# Patient Record
Sex: Female | Born: 1980 | Race: Asian | Hispanic: No | Smoking: Former smoker
Health system: Southern US, Community
[De-identification: ages and names within clinical notes are randomized; demographics above are authoritative.]

## PROBLEM LIST (undated history)

## (undated) DIAGNOSIS — Z789 Other specified health status: Secondary | ICD-10-CM

## (undated) DIAGNOSIS — G43909 Migraine, unspecified, not intractable, without status migrainosus: Secondary | ICD-10-CM

## (undated) HISTORY — DX: Migraine, unspecified, not intractable, without status migrainosus: G43.909

## (undated) HISTORY — PX: NO PAST SURGERIES: SHX2092

---

## 2006-02-28 ENCOUNTER — Inpatient Hospital Stay (HOSPITAL_COMMUNITY): Admission: AD | Admit: 2006-02-28 | Discharge: 2006-02-28 | Payer: Self-pay | Admitting: Obstetrics & Gynecology

## 2006-03-05 ENCOUNTER — Inpatient Hospital Stay (HOSPITAL_COMMUNITY): Admission: AD | Admit: 2006-03-05 | Discharge: 2006-03-06 | Payer: Self-pay | Admitting: Obstetrics & Gynecology

## 2006-03-09 ENCOUNTER — Ambulatory Visit: Payer: Self-pay | Admitting: Hematology and Oncology

## 2006-03-14 ENCOUNTER — Inpatient Hospital Stay (HOSPITAL_COMMUNITY): Admission: AD | Admit: 2006-03-14 | Discharge: 2006-03-18 | Payer: Self-pay | Admitting: Obstetrics & Gynecology

## 2006-03-23 LAB — CBC & DIFF AND RETIC
IRF: 0.54 — ABNORMAL HIGH (ref 0.130–0.330)
MCH: 20.4 pg — ABNORMAL LOW (ref 26.0–34.0)
MONO#: 2.4 10*3/uL — ABNORMAL HIGH (ref 0.1–0.9)
NEUT%: 84.3 % — ABNORMAL HIGH (ref 39.6–76.8)
RDW: 12.3 % (ref 11.3–14.5)
RETIC #: 138.9 10*3/uL — ABNORMAL HIGH (ref 19.7–115.1)
Retic %: 2.8 % — ABNORMAL HIGH (ref 0.4–2.3)
lymph#: 3.1 10*3/uL (ref 0.9–3.3)

## 2006-03-23 LAB — CHCC SMEAR

## 2006-03-26 LAB — IRON AND TIBC
%SAT: 60 % — ABNORMAL HIGH (ref 20–55)
Iron: 181 ug/dL — ABNORMAL HIGH (ref 42–145)

## 2006-03-26 LAB — HEMOGLOBINOPATHY EVALUATION
Hgb A2 Quant: 6.6 % — ABNORMAL HIGH (ref 2.2–3.2)
Hgb F Quant: 0.6 % — ABNORMAL HIGH (ref 0.0–0.5)
Hgb S Quant: 0 % (ref 0.0–0.0)

## 2006-03-26 LAB — COMPREHENSIVE METABOLIC PANEL
Albumin: 3.7 g/dL (ref 3.5–5.2)
Alkaline Phosphatase: 34 U/L — ABNORMAL LOW (ref 39–117)
Calcium: 9.2 mg/dL (ref 8.4–10.5)
Chloride: 97 mEq/L (ref 96–112)
Total Bilirubin: 0.4 mg/dL (ref 0.3–1.2)

## 2006-04-19 LAB — FERRITIN: Ferritin: 126 ng/mL (ref 10–291)

## 2006-04-19 LAB — CBC WITH DIFFERENTIAL/PLATELET
EOS%: 1.5 % (ref 0.0–7.0)
HCT: 27.3 % — ABNORMAL LOW (ref 34.8–46.6)
HGB: 8.8 g/dL — ABNORMAL LOW (ref 11.6–15.9)
MCH: 22.2 pg — ABNORMAL LOW (ref 26.0–34.0)
MCHC: 32.2 g/dL (ref 32.0–36.0)
MONO%: 6.7 % (ref 0.0–13.0)
Platelets: 271 10*3/uL (ref 145–400)
WBC: 8.8 10*3/uL (ref 3.9–10.0)

## 2006-04-19 LAB — IRON AND TIBC
%SAT: 26 % (ref 20–55)
Iron: 71 ug/dL (ref 42–145)
UIBC: 204 ug/dL

## 2006-05-17 ENCOUNTER — Ambulatory Visit (HOSPITAL_COMMUNITY): Admission: RE | Admit: 2006-05-17 | Discharge: 2006-05-17 | Payer: Self-pay | Admitting: Obstetrics & Gynecology

## 2006-06-11 ENCOUNTER — Ambulatory Visit: Payer: Self-pay | Admitting: Hematology and Oncology

## 2006-10-01 ENCOUNTER — Inpatient Hospital Stay (HOSPITAL_COMMUNITY): Admission: AD | Admit: 2006-10-01 | Discharge: 2006-10-01 | Payer: Self-pay | Admitting: Obstetrics & Gynecology

## 2006-10-04 ENCOUNTER — Inpatient Hospital Stay (HOSPITAL_COMMUNITY): Admission: AD | Admit: 2006-10-04 | Discharge: 2006-10-06 | Payer: Self-pay | Admitting: Obstetrics

## 2007-06-22 IMAGING — US US OB COMP LESS 14 WK
1 series · 13 of 28 positions shown · non-contrast
Comparison: none

CLINICAL DATA: Nausea and vomiting. Assess dates.  
OBSTETRICAL ULTRASOUND <14 WKS AND TRANSVAGINAL OB US:
TECHNIQUE: Both transabdominal and transvaginal ultrasound examinations were performed for complete evaluation of the gestation as well as the maternal uterus, adnexal regions, and pelvic cul-de-sac.

[Series 1: us ob comp less 14 wk · 0.22mm/px · 13 of 61 slices shown]
[im 3/61]
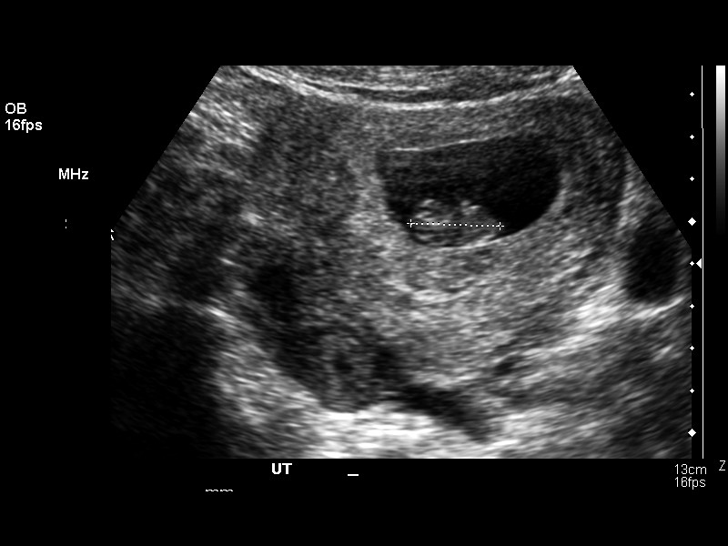
[im 7/61]
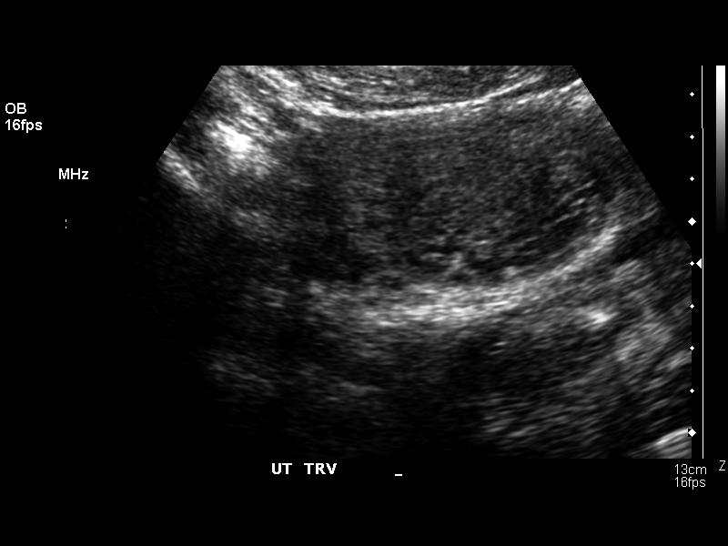
[im 12/61]
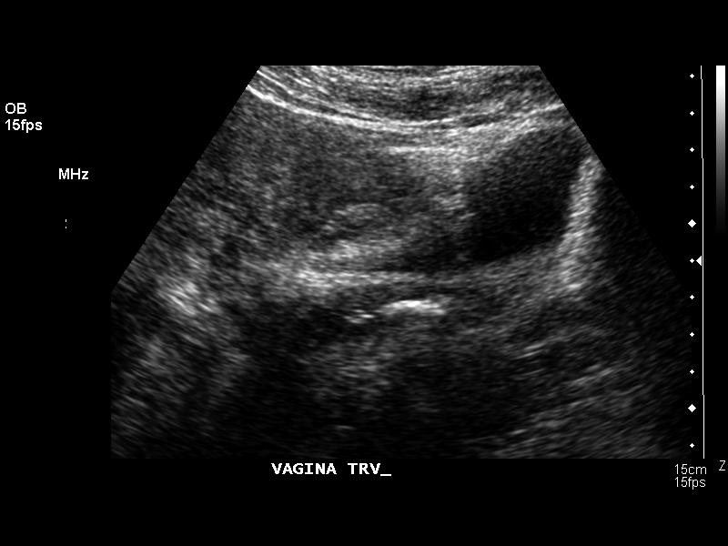
[im 16/61]
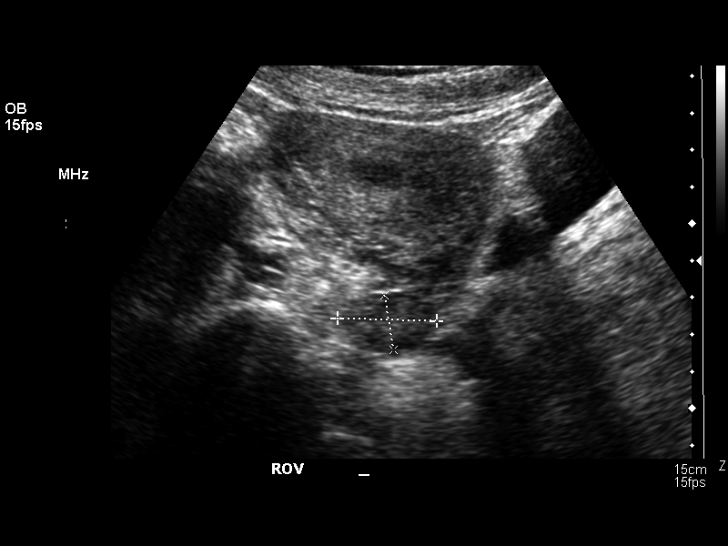
[im 21/61]
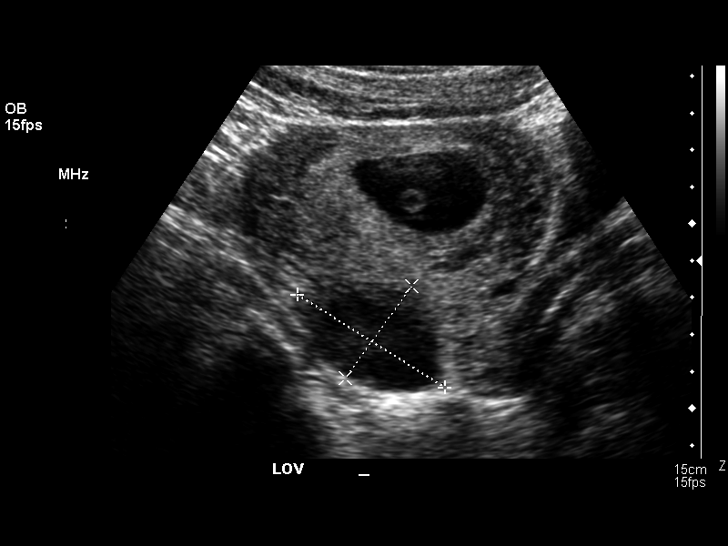
[im 25/61]
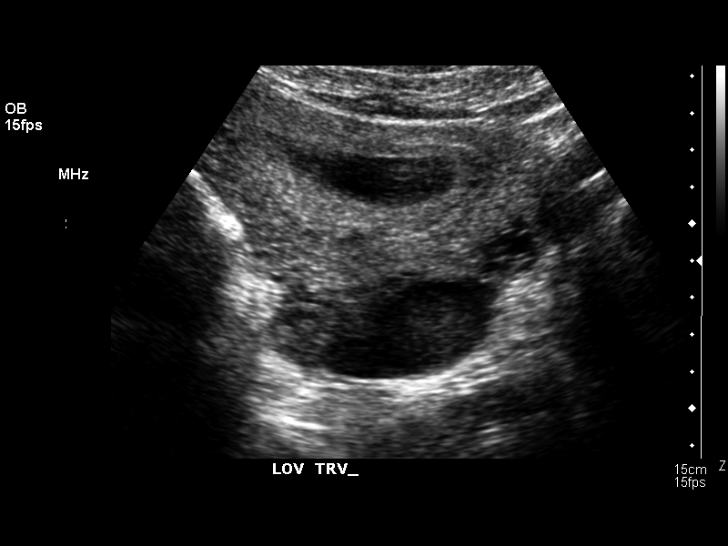
[im 32/61]
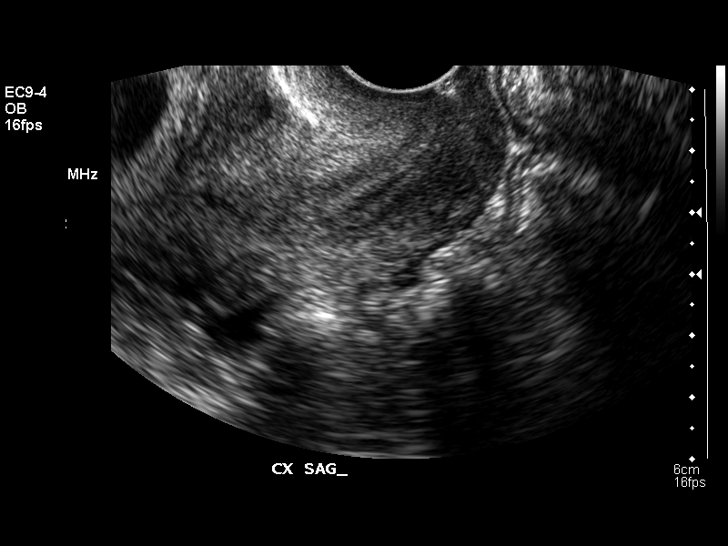
[im 36/61]
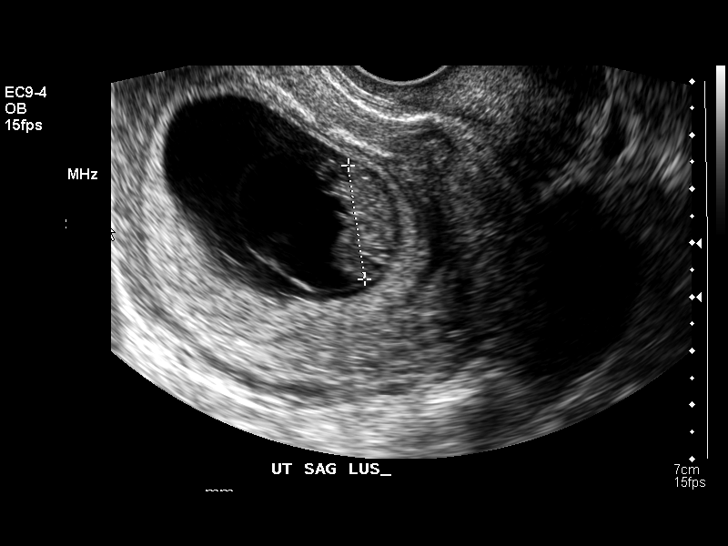
[im 41/61]
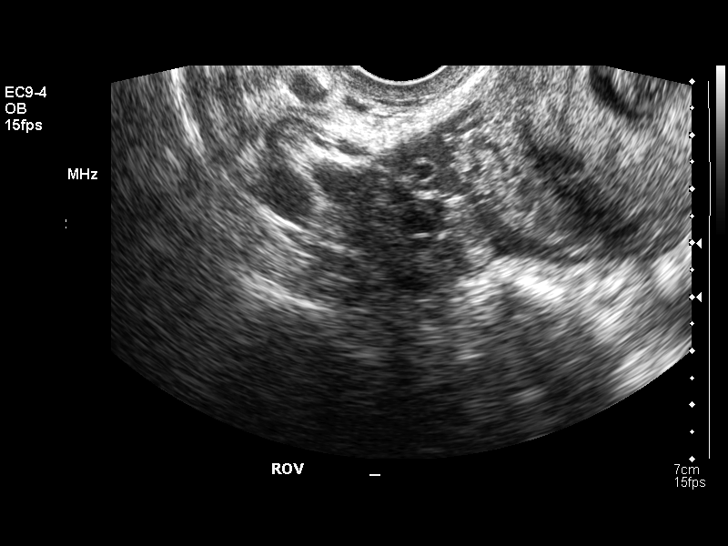
[im 45/61]
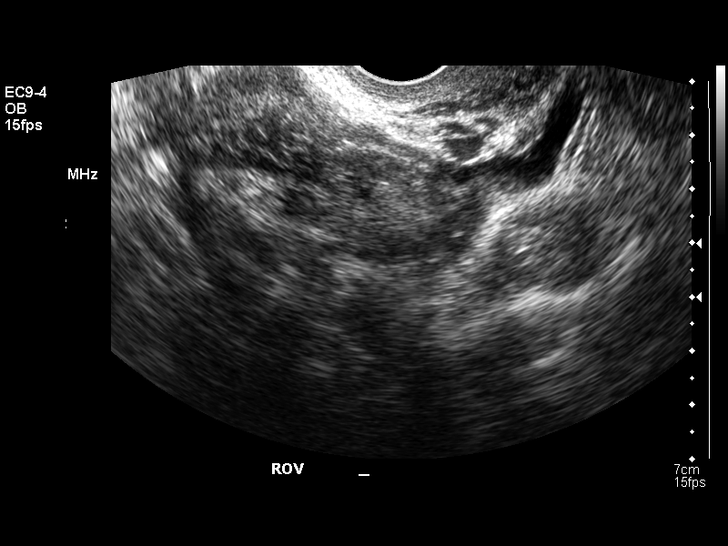
[im 49/61]
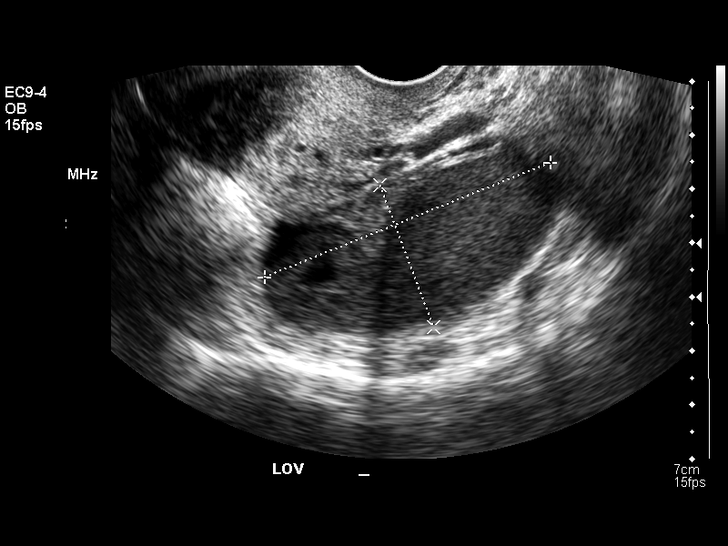
[im 54/61]
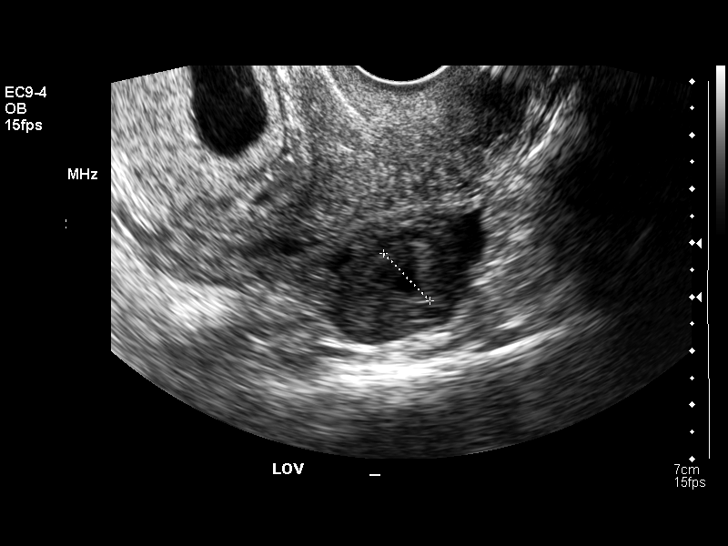
[im 58/61]
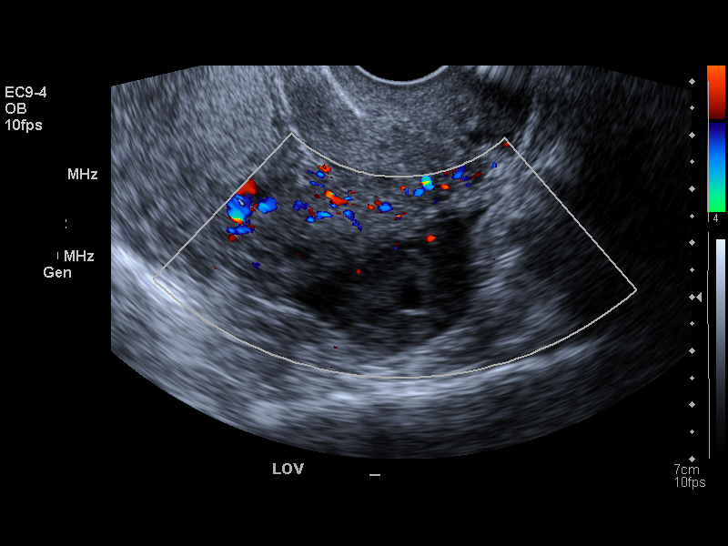

[13 of 28 positions shown; findings below may reference images not displayed]

FINDINGS: Multiple images of the uterus and adnexa were obtained using a transabdominal and endovaginal approaches.
There is a single intrauterine pregnancy identified that demonstrates an estimated gestational age by crown rump length of 8 weeks and 5 days.  Positive regular fetal cardiac activity with a rate of 171 bpm was noted.  A normal appearing yolk sac and amnion are seen.  No signs of subchorionic hemorrhage are evident.
The right ovary has a normal appearance measuring 2.5 x 1.6 x 2.5 cm.  The left ovary measures 5.7 x 2.8 x 4.4 cm and contains a corpus luteum cyst, as well as a unilocular complex cyst which measures 3.8 x 3.8 x 2.3 cm and contains diffuse low level echoes.  The overall appearance is most suggestive of either a hemorrhagic cyst or endometrioma and follow-up evaluation is recommended at anatomic reassessment.  If this finding persists it most likely represents an endometrioma.  If interval resolution takes place the finding would be compatible with a hemorrhagic cyst.
IMPRESSION: 8 week 5 day living intrauterine pregnancy.  Hemorrhagic cyst versus endometrioma of the left ovary.  Recommend reevaluation at the time of anatomic assessment for help in differentiating between these two possibilities.

## 2007-09-03 IMAGING — US US OB COMP +14 WK
1 series · 13 of 28 positions shown · non-contrast
Comparison: none

CLINICAL DATA: Anatomic exam.  No current problems.

[Series 1: us ob comp +14 wk · 0.29mm/px · 13 of 124 slices shown]
[im 5/124]
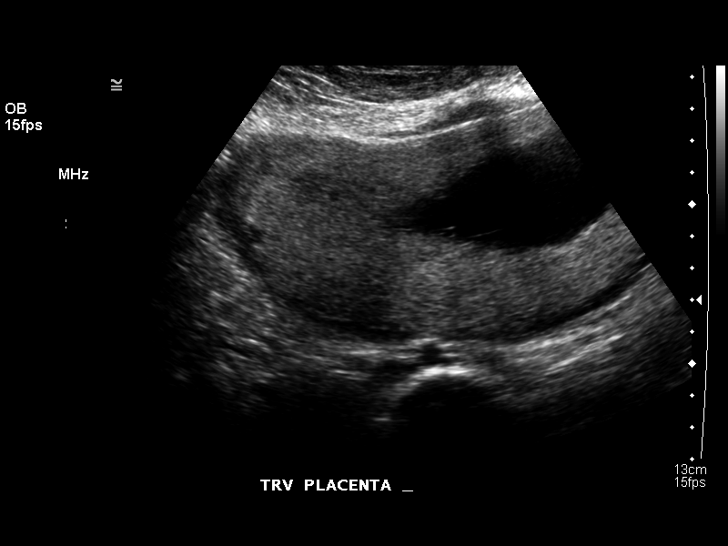
[im 14/124]
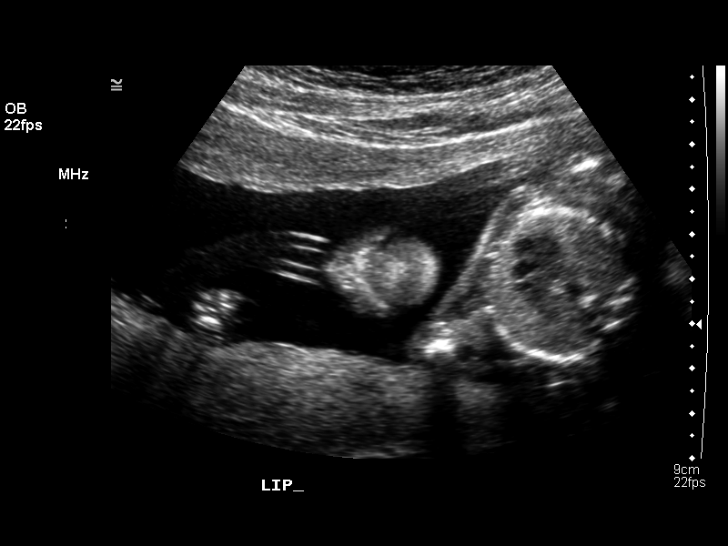
[im 23/124]
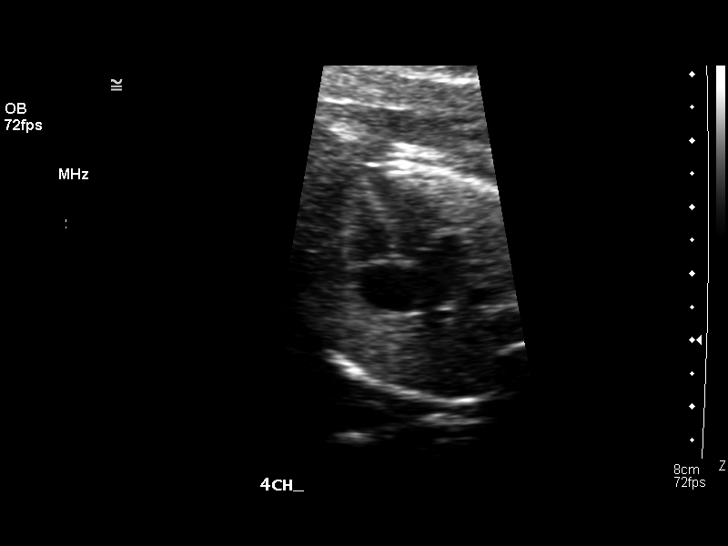
[im 32/124]
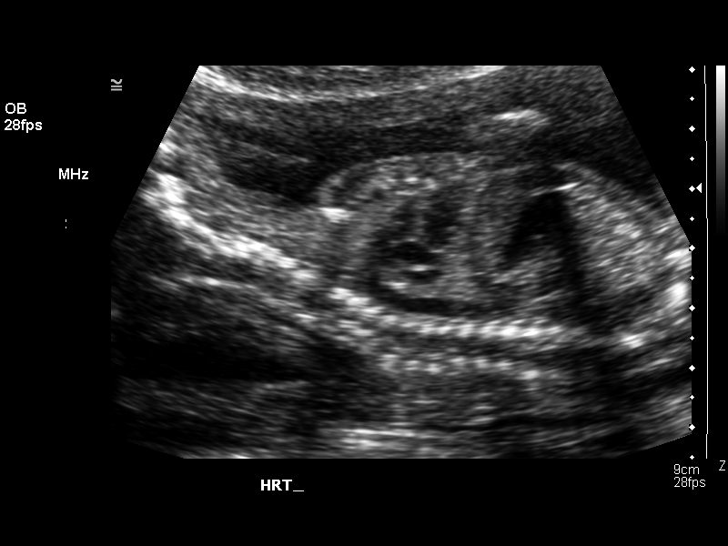
[im 42/124]
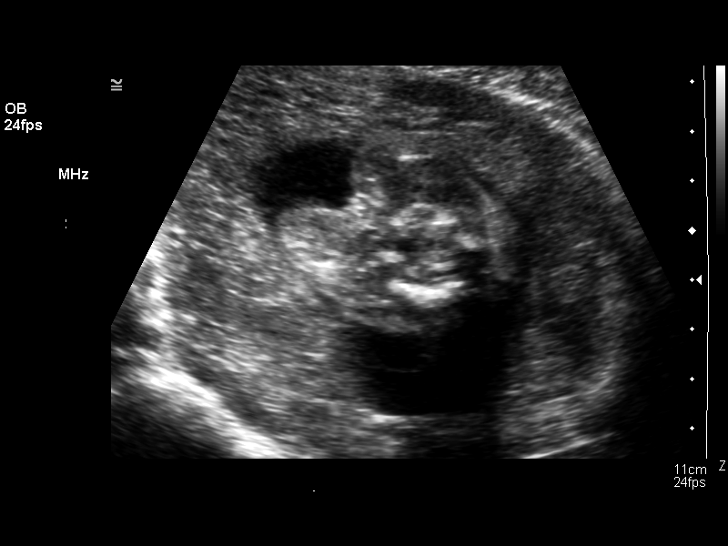
[im 51/124]
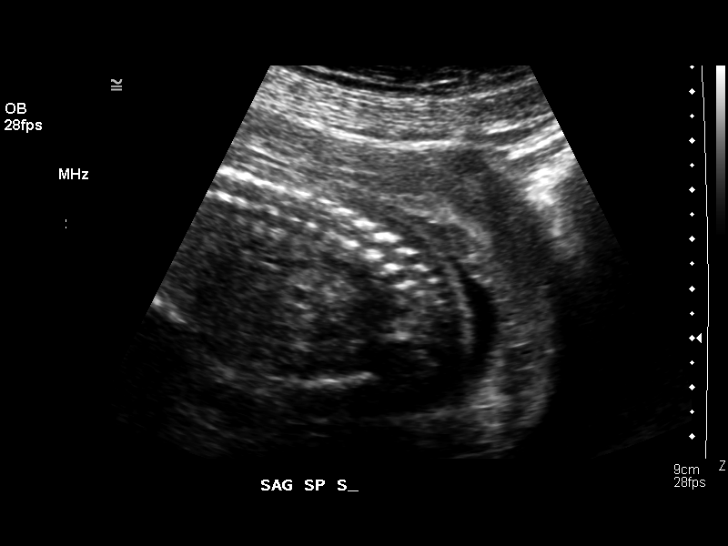
[im 64/124]
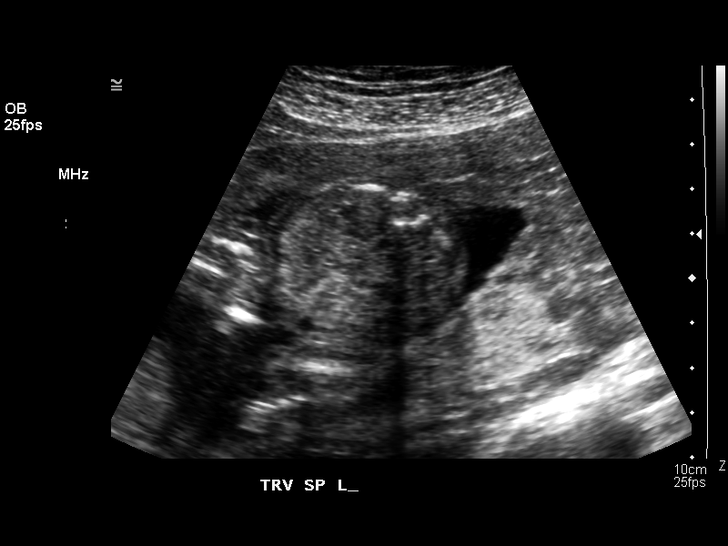
[im 73/124]
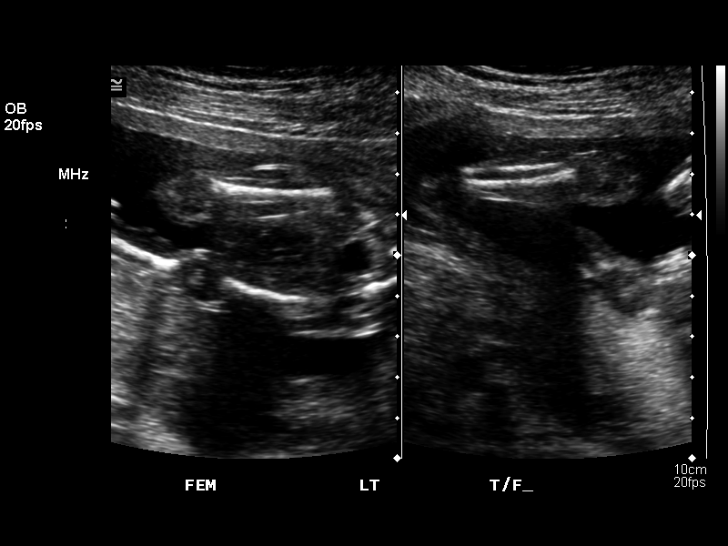
[im 83/124]
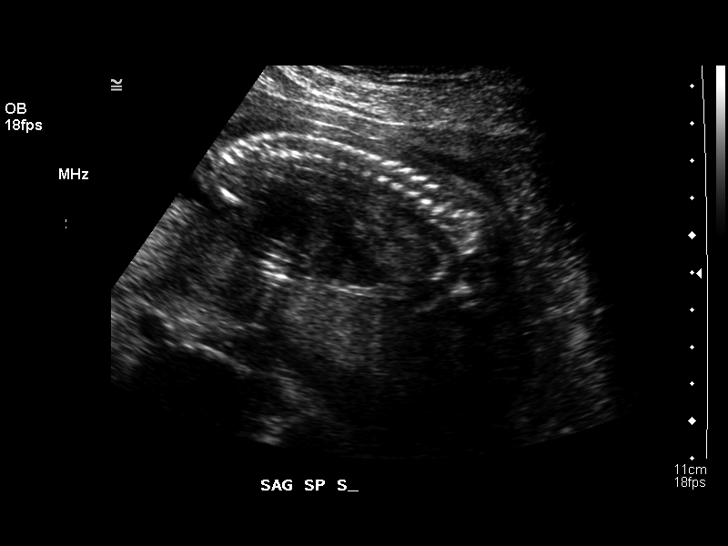
[im 92/124]
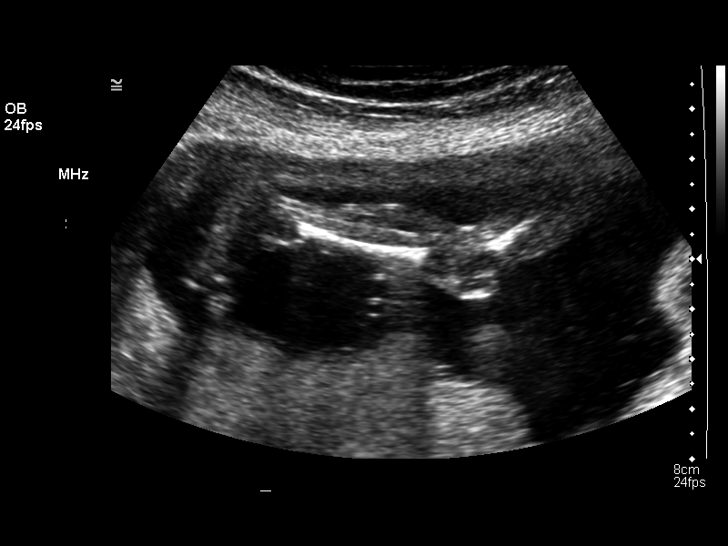
[im 101/124]
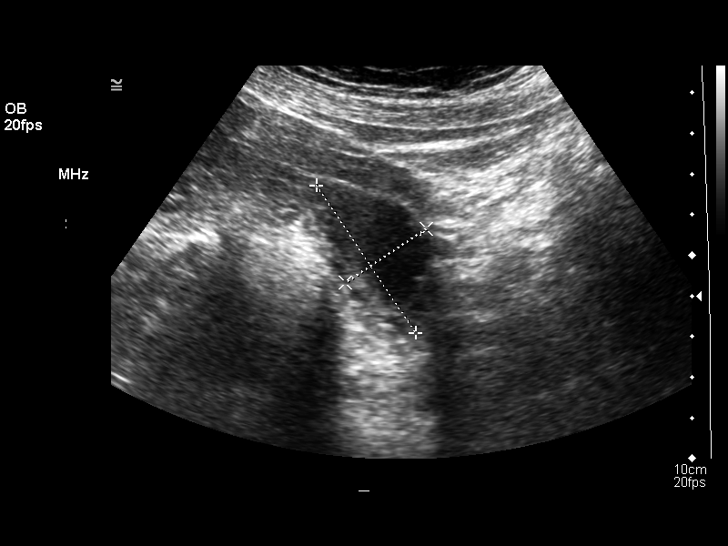
[im 110/124]
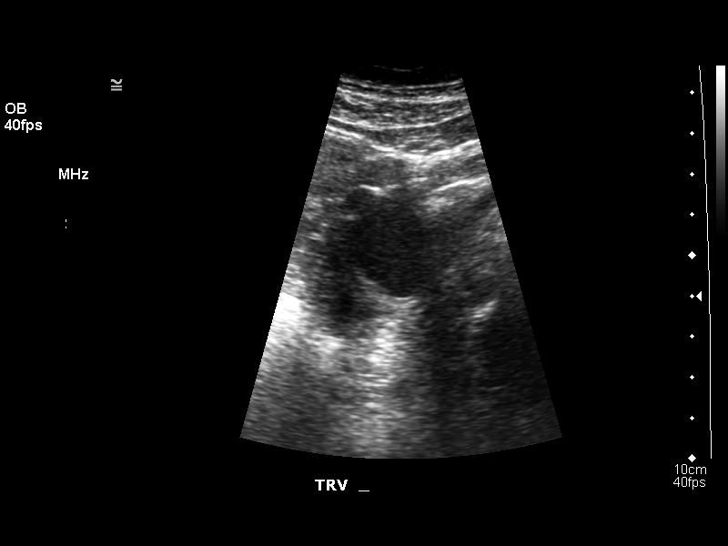
[im 119/124]
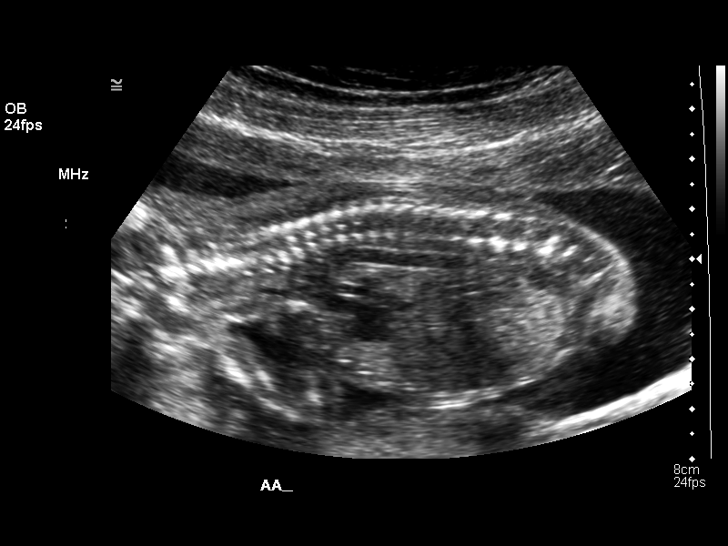

[13 of 28 positions shown; findings below may reference images not displayed]

OBSTETRICAL ULTRASOUND:
 Number of Fetuses:  1
 Heart Rate:  157 bpm
 Movement:  Yes
 Breathing:  No  
 Presentation:  Variable
 Placental Location:  Posterior
 Grade:  I
 Previa:  No
 Amniotic Fluid (Subjective):  Normal
 Amniotic Fluid (Objective):   5.2 cm vertical pocket 

 FETAL BIOMETRY
 BPD:   4.5 cm  19 w 4 d
 HC:   16.9 cm  19 w 4 d
 AC:   14.9 cm  20 w 1 d
 FL:  3.0 cm  19 w 2 d

 MEAN GA:    19 w 5 d    US EDC:  10/06/06
 Assigned GA: 19 w 6 d  Assigned EDC: 10/05/06

 FETAL ANATOMY
 Lateral Ventricles:  Visualized  
 Thalami/CSP:  Visualized  
 Posterior Fossa:  Visualized   
 Nuchal Region:  Visualized 
 Spine:  Visualized  
 4 Chamber Heart on Left:  Visualized  
 Stomach on Left:  Visualized  
 3 Vessel Cord:  Visualized 
 Cord Insertion site:  Visualized 
 Kidneys:  Visualized   
 Bladder:  Visualized   
 Extremities:  Visualized  

 ADDITIONAL ANATOMY VISUALIZED:  LVOT, RVOT, upper lip, orbits, profile, diaphragm, heel, 5th digit, ductal arch, and aortic arch.

 MATERNAL UTERINE AND ADNEXAL FINDINGS
 Cervix:   3.3 cm transabdominally.

 Noted is the presence of a simple left ovarian cyst measuring 2.1 x 3.0 x 1.6 cm.
IMPRESSION: 1.  Single intrauterine pregnancy demonstrating an estimated gestational age by ultrasound of 19 weeks and 5 days.  Correlation with assigned gestational age by LMP of 19 weeks and 6 days correlates with appropriate growth.
 2.  No focal fetal or placental abnormalities are noted with a good anatomic exam possible.
 3.  A simple left ovarian cyst is identified with measurements as noted above.  As this has persisted past 16 weeks of gestation, this is unlikely to represent corpus luteum cyst.  This may represent a persistent functional cyst of other etiology or a benign neoplastic process.  This could be evaluated later in gestation if additional obstetrical scans are performed or postpartum.  The simple nature suggests that this is benign in etiology.

## 2014-06-24 ENCOUNTER — Ambulatory Visit (HOSPITAL_COMMUNITY): Payer: BC Managed Care – PPO

## 2014-06-30 ENCOUNTER — Other Ambulatory Visit (HOSPITAL_COMMUNITY): Payer: Self-pay | Admitting: Obstetrics and Gynecology

## 2014-06-30 DIAGNOSIS — Z3491 Encounter for supervision of normal pregnancy, unspecified, first trimester: Secondary | ICD-10-CM

## 2014-06-30 DIAGNOSIS — Z36 Encounter for antenatal screening for chromosomal anomalies: Secondary | ICD-10-CM

## 2014-07-01 ENCOUNTER — Ambulatory Visit (HOSPITAL_COMMUNITY): Payer: BC Managed Care – PPO

## 2014-07-14 ENCOUNTER — Encounter (HOSPITAL_COMMUNITY): Payer: Self-pay

## 2014-07-14 ENCOUNTER — Ambulatory Visit (HOSPITAL_COMMUNITY)
Admission: RE | Admit: 2014-07-14 | Discharge: 2014-07-14 | Disposition: A | Discharge status: Home / Self Care | Payer: BC Managed Care – PPO | Source: Ambulatory Visit | Attending: Obstetrics and Gynecology | Admitting: Obstetrics and Gynecology

## 2014-07-14 DIAGNOSIS — Z36 Encounter for antenatal screening for chromosomal anomalies: Secondary | ICD-10-CM | POA: Insufficient documentation

## 2014-07-14 DIAGNOSIS — O09529 Supervision of elderly multigravida, unspecified trimester: Secondary | ICD-10-CM | POA: Insufficient documentation

## 2014-07-14 DIAGNOSIS — Z3A11 11 weeks gestation of pregnancy: Secondary | ICD-10-CM | POA: Insufficient documentation

## 2014-07-14 DIAGNOSIS — Z315 Encounter for genetic counseling: Secondary | ICD-10-CM | POA: Diagnosis not present

## 2014-07-14 DIAGNOSIS — O09521 Supervision of elderly multigravida, first trimester: Secondary | ICD-10-CM | POA: Insufficient documentation

## 2014-07-14 DIAGNOSIS — Z3491 Encounter for supervision of normal pregnancy, unspecified, first trimester: Secondary | ICD-10-CM

## 2014-07-14 HISTORY — DX: Other specified health status: Z78.9

## 2014-07-14 LAB — ROUTINE CHROMOSOME - KARYOTYPE + FISH

## 2014-07-15 ENCOUNTER — Telehealth (HOSPITAL_COMMUNITY): Payer: Self-pay

## 2014-07-15 NOTE — Telephone Encounter (Signed)
Glenda BouillonVictoria Weiss, [facility] Genetic counseling intern, called [name] to discuss the preliminary FISH results from her CVS.  We reviewed that the results revealed an extra signal for chromosome 21 in all cells analyzed.  We discussed that this is consistent with a diagnosis of Down syndrome.  We again discussed the limitations of FISH and that final results are still pending and will be available in 1-2 weeks.  Patient was offered f/u genetic counseling to discuss the preliminary results.  She would like to discuss this with her husband and will call back to let us know if she is interested in scheduling an appointment.  She is undecided re: TOP at this time.  All questions were answered to her satisfaction, she was encouraged to call with additional questions or concerns.  Marjie SkiffVictoria Weiss [employer] Genetic Counseling Intern  Donald Prosehristy S. Sheffield Hawker, MS Certified Genetic Counselor

## 2014-07-16 ENCOUNTER — Other Ambulatory Visit (HOSPITAL_COMMUNITY): Payer: Self-pay

## 2014-07-16 NOTE — Progress Notes (Signed)
Genetic Counseling  High-Risk Gestation Note  Appointment Date:  07/14/2014 Referred By: Glenda LaurenceHorvath, Michelle A, MD Date of Birth:  1981/01/22 Partner:  Glenda Weiss   Pregnancy History: [id number] Estimated Date of Delivery: 01/28/15 Estimated Gestational Age: 5471w5d Attending: Rema FendtJoshua Nitsche, MD   Glenda Weiss and her partner, Glenda Weiss, were seen for genetic counseling because of Weiss maternal age of 33 y.o.. She will be 33 years old at delivery. [employer] Genetic Counseling Intern, Glenda Weiss, assisted with genetic counseling, under my direct supervision.     They were counseled regarding maternal age and the association with risk for chromosome conditions due to nondisjunction with aging of the ova.   We reviewed chromosomes, nondisjunction, and the associated 1 in 114 risk for fetal aneuploidy at 5371w5d gestation related to Weiss maternal age of 33 years old at delivery.  They were counseled that the risk for aneuploidy decreases as gestational age increases, accounting for those pregnancies which spontaneously abort.  We specifically discussed Down syndrome (trisomy 8821), trisomies 4513 and 318, and sex chromosome aneuploidies (47,XXX and 47,XXY) including the common features and prognoses of each.   We reviewed available screening options including First Screen, Quad screen, noninvasive prenatal screening (NIPS)/cell free DNA (cfDNA) testing, and detailed ultrasound.  They were counseled that screening tests are used to modify Weiss patient's Weiss priori risk for aneuploidy, typically based on age. This estimate provides Weiss pregnancy specific risk assessment. We reviewed the benefits and limitations of each option. Specifically, we discussed the conditions for which each test screens, the detection rates, and false positive rates of each. They were also counseled regarding diagnostic testing via CVS and amniocentesis. We discussed the risks, benefits, and limitations of each. Regarding CVS, we  discussed the associated risks for maternal cell contamination and confined placental mosaicism. We reviewed the approximate 1 in 100 risk for complications for CVS and the approximate 1 in 300-500 risk for complications for amniocentesis, including spontaneous pregnancy loss. They understand that CVS and amniocentesis do not typically assess for single gene conditions and would not diagnose or rule out all genetic conditions. After consideration of all the options, they elected to proceed with CVS, which was performed today under ultrasound guidance and sent for aneuploidy FISH and karyotype analyses. Complete ultrasound report sent under separate cover.   Both family histories were reviewed and found to be contributory congenital hearing loss/deafness for the patient's female maternal first cousin. He reportedly communicates through sign language. He is reportedly healthy and does not have intellectual disability or other medical conditions. The underlying etiology for his deafness was not known by the patient. Hearing loss can have many causes including genetic factors, environmental factors or Weiss combination of both.  Sometimes hearing loss can occur as one feature of an underlying genetic condition or may be caused by Weiss single nonworking gene.  Given the degree of relation, recurrence risk for the current pregnancy would likely be low. However, additional information regarding Weiss cause for their hearing loss is needed in order to most accurately assess the chance for their children. The family histories were otherwise unremarkable for birth defects, intellectual disability, and known genetic conditions. Without further information regarding the provided family history, an accurate genetic risk cannot be calculated. Further genetic counseling is warranted if more information is obtained.  Glenda Weiss denied exposure to environmental toxins or chemical agents. She denied the use of alcohol, tobacco or  street drugs. She denied significant viral illnesses during the course of her  pregnancy. Her medical and surgical histories were noncontributory.   I counseled this couple regarding the above risks and available options.  The approximate face-to-face time with the genetic counselor was 40 minutes.  Glenda PlowmanKaren Kamren Heintzelman, MS,  Certified Genetic Counselor 07/16/2014

## 2014-07-23 ENCOUNTER — Telehealth (HOSPITAL_COMMUNITY): Payer: Self-pay | Admitting: MS"

## 2014-07-23 ENCOUNTER — Other Ambulatory Visit (HOSPITAL_COMMUNITY): Payer: Self-pay

## 2014-07-23 NOTE — Telephone Encounter (Signed)
Left message for patient to return call.   Glenda Weiss 07/23/2014 12:48 PM

## 2014-07-23 NOTE — Telephone Encounter (Signed)
Called [name] regarding final karyotype result from [facility]. Patient identified by name and DOB. Discussed that these are consistent with [facility] results and that an extra 21 chromosome was detected, consistent with Down syndrome. 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 [name] stated that she does not want to know the sex from the chromosome results. This was not disclosed to her. Asked [name] if she has given much thought about what this result means for her or the father of the pregnancy as far as continuing the pregnancy or not. She stated that she has not thought about it much and that she needs to talk with the father of the pregnancy. Offered follow-up genetic counseling to discuss this more in person and to discuss resources and information regarding Down syndrome. She declined follow-up genetic counseling. Directed [name] to the website for the National Down syndrome Congress to obtain additional information regarding Down syndrome. She stated that she has a follow-up appointment with Dr. Henderson CloudHorvath in early December. Encouraged her to contact me with additional questions or concerns.   Clydie BraunKaren Medford Staheli 07/23/2014 3:46 PM

## 2014-07-28 ENCOUNTER — Other Ambulatory Visit (HOSPITAL_COMMUNITY): Payer: Self-pay

## 2014-08-05 ENCOUNTER — Ambulatory Visit (HOSPITAL_COMMUNITY)
Admission: RE | Admit: 2014-08-05 | Discharge: 2014-08-05 | Disposition: A | Discharge status: Home / Self Care | Payer: BC Managed Care – PPO | Source: Ambulatory Visit | Attending: Obstetrics and Gynecology | Admitting: Obstetrics and Gynecology

## 2014-08-05 ENCOUNTER — Ambulatory Visit (HOSPITAL_COMMUNITY): Payer: BC Managed Care – PPO | Admitting: Anesthesiology

## 2014-08-05 ENCOUNTER — Encounter (HOSPITAL_COMMUNITY): Payer: Self-pay | Admitting: *Deleted

## 2014-08-05 ENCOUNTER — Other Ambulatory Visit: Payer: Self-pay | Admitting: Obstetrics and Gynecology

## 2014-08-05 ENCOUNTER — Encounter (HOSPITAL_COMMUNITY)
Admission: RE | Disposition: A | Discharge status: Home / Self Care | Payer: Self-pay | Source: Ambulatory Visit | Attending: Obstetrics and Gynecology

## 2014-08-05 DIAGNOSIS — O99012 Anemia complicating pregnancy, second trimester: Secondary | ICD-10-CM | POA: Insufficient documentation

## 2014-08-05 DIAGNOSIS — Z87891 Personal history of nicotine dependence: Secondary | ICD-10-CM | POA: Insufficient documentation

## 2014-08-05 DIAGNOSIS — O351XX0 Maternal care for (suspected) chromosomal abnormality in fetus, not applicable or unspecified: Secondary | ICD-10-CM | POA: Insufficient documentation

## 2014-08-05 DIAGNOSIS — D649 Anemia, unspecified: Secondary | ICD-10-CM | POA: Diagnosis not present

## 2014-08-05 DIAGNOSIS — Z3A15 15 weeks gestation of pregnancy: Secondary | ICD-10-CM | POA: Insufficient documentation

## 2014-08-05 DIAGNOSIS — Q909 Down syndrome, unspecified: Secondary | ICD-10-CM

## 2014-08-05 DIAGNOSIS — Z332 Encounter for elective termination of pregnancy: Secondary | ICD-10-CM | POA: Diagnosis not present

## 2014-08-05 HISTORY — PX: DILATION AND EVACUATION: SHX1459

## 2014-08-05 LAB — ABO/RH: ABO/RH(D): B POS

## 2014-08-05 LAB — CBC
HEMATOCRIT: 30.8 % — AB (ref 36.0–46.0)
Hemoglobin: 9.9 g/dL — ABNORMAL LOW (ref 12.0–15.0)
MCH: 21.6 pg — AB (ref 26.0–34.0)
MCHC: 32.1 g/dL (ref 30.0–36.0)
MCV: 67.1 fL — AB (ref 78.0–100.0)
Platelets: 173 10*3/uL (ref 150–400)
RBC: 4.59 MIL/uL (ref 3.87–5.11)
RDW: 15.1 % (ref 11.5–15.5)
WBC: 8.1 10*3/uL (ref 4.0–10.5)

## 2014-08-05 SURGERY — DILATION AND EVACUATION, UTERUS, SECOND TRIMESTER
Anesthesia: General | Site: Uterus

## 2014-08-05 MED ORDER — PROPOFOL 10 MG/ML IV BOLUS
INTRAVENOUS | Status: DC | PRN
  Administered 2014-08-05: 200 mg via INTRAVENOUS

## 2014-08-05 MED ORDER — SCOPOLAMINE 1 MG/3DAYS TD PT72
MEDICATED_PATCH | TRANSDERMAL | Status: AC
  Filled 2014-08-05: qty 1

## 2014-08-05 MED ORDER — FENTANYL CITRATE 0.05 MG/ML IJ SOLN
INTRAMUSCULAR | Status: AC
  Filled 2014-08-05: qty 2

## 2014-08-05 MED ORDER — LACTATED RINGERS IV SOLN
INTRAVENOUS | Status: DC
  Administered 2014-08-05: 125 mL/h via INTRAVENOUS
  Administered 2014-08-05: 10:00:00 via INTRAVENOUS

## 2014-08-05 MED ORDER — CEFAZOLIN SODIUM-DEXTROSE 2-3 GM-% IV SOLR
INTRAVENOUS | Status: AC
  Filled 2014-08-05: qty 50

## 2014-08-05 MED ORDER — FENTANYL CITRATE 0.05 MG/ML IJ SOLN
INTRAMUSCULAR | Status: AC
  Filled 2014-08-05: qty 5

## 2014-08-05 MED ORDER — LIDOCAINE HCL (CARDIAC) 20 MG/ML IV SOLN
INTRAVENOUS | Status: DC | PRN
  Administered 2014-08-05: 70 mg via INTRAVENOUS

## 2014-08-05 MED ORDER — PROMETHAZINE HCL 25 MG/ML IJ SOLN
INTRAMUSCULAR | Status: AC
  Administered 2014-08-05: 6.25 mg via INTRAVENOUS
  Filled 2014-08-05: qty 1

## 2014-08-05 MED ORDER — KETOROLAC TROMETHAMINE 30 MG/ML IJ SOLN
INTRAMUSCULAR | Status: DC | PRN
  Administered 2014-08-05: 30 mg via INTRAVENOUS

## 2014-08-05 MED ORDER — PROMETHAZINE HCL 25 MG/ML IJ SOLN
6.2500 mg | INTRAMUSCULAR | Status: DC | PRN
Start: 2014-08-05 — End: 2014-08-05
  Administered 2014-08-05: 6.25 mg via INTRAVENOUS

## 2014-08-05 MED ORDER — MIDAZOLAM HCL 2 MG/2ML IJ SOLN
INTRAMUSCULAR | Status: AC
  Filled 2014-08-05: qty 2

## 2014-08-05 MED ORDER — SCOPOLAMINE 1 MG/3DAYS TD PT72
1.0000 | MEDICATED_PATCH | Freq: Once | TRANSDERMAL | Status: DC
  Administered 2014-08-05: 1.5 mg via TRANSDERMAL

## 2014-08-05 MED ORDER — FENTANYL CITRATE 0.05 MG/ML IJ SOLN
25.0000 ug | INTRAMUSCULAR | Status: DC | PRN
  Administered 2014-08-05: 50 ug via INTRAVENOUS

## 2014-08-05 MED ORDER — ONDANSETRON HCL 4 MG/2ML IJ SOLN
INTRAMUSCULAR | Status: AC
  Filled 2014-08-05: qty 2

## 2014-08-05 MED ORDER — METHYLERGONOVINE MALEATE 0.2 MG/ML IJ SOLN
INTRAMUSCULAR | Status: DC | PRN
  Administered 2014-08-05: 0.2 mg via INTRAMUSCULAR

## 2014-08-05 MED ORDER — DEXAMETHASONE SODIUM PHOSPHATE 4 MG/ML IJ SOLN
INTRAMUSCULAR | Status: AC
  Filled 2014-08-05: qty 1

## 2014-08-05 MED ORDER — ONDANSETRON HCL 4 MG/2ML IJ SOLN
INTRAMUSCULAR | Status: DC | PRN
  Administered 2014-08-05: 4 mg via INTRAVENOUS

## 2014-08-05 MED ORDER — LIDOCAINE HCL 1 % IJ SOLN
INTRAMUSCULAR | Status: AC
  Filled 2014-08-05: qty 20

## 2014-08-05 MED ORDER — OXYCODONE-ACETAMINOPHEN 10-325 MG PO TABS: 1.0000 | ORAL_TABLET | ORAL | Status: DC | PRN

## 2014-08-05 MED ORDER — FENTANYL CITRATE 0.05 MG/ML IJ SOLN
INTRAMUSCULAR | Status: DC | PRN
  Administered 2014-08-05 (×2): 50 ug via INTRAVENOUS

## 2014-08-05 MED ORDER — LIDOCAINE HCL 1 % IJ SOLN
INTRAMUSCULAR | Status: DC | PRN
Start: 2014-08-05 — End: 2014-08-05
  Administered 2014-08-05: 20 mL

## 2014-08-05 MED ORDER — CEFAZOLIN SODIUM-DEXTROSE 2-3 GM-% IV SOLR
2.0000 g | Freq: Once | INTRAVENOUS | Status: AC
Start: 2014-08-05 — End: 2014-08-05
  Administered 2014-08-05: 2 g via INTRAVENOUS

## 2014-08-05 MED ORDER — KETOROLAC TROMETHAMINE 30 MG/ML IJ SOLN
INTRAMUSCULAR | Status: AC
  Filled 2014-08-05: qty 1

## 2014-08-05 MED ORDER — 0.9 % SODIUM CHLORIDE (POUR BTL) OPTIME
TOPICAL | Status: DC | PRN
  Administered 2014-08-05: 1000 mL

## 2014-08-05 MED ORDER — MIDAZOLAM HCL 2 MG/2ML IJ SOLN
INTRAMUSCULAR | Status: DC | PRN
  Administered 2014-08-05: 2 mg via INTRAVENOUS

## 2014-08-05 MED ORDER — DEXAMETHASONE SODIUM PHOSPHATE 4 MG/ML IJ SOLN
INTRAMUSCULAR | Status: DC | PRN
  Administered 2014-08-05: 4 mg via INTRAVENOUS

## 2014-08-05 MED ORDER — METHYLERGONOVINE MALEATE 0.2 MG PO TABS: 0.2000 mg | ORAL_TABLET | Freq: Four times a day (QID) | ORAL | Status: DC

## 2014-08-05 SURGICAL SUPPLY — 18 items
CLOTH BEACON ORANGE TIMEOUT ST (SAFETY) ×1 IMPLANT
CONT PATH 16OZ SNAP LID 3702 (MISCELLANEOUS) ×1 IMPLANT
CONT SPEC 4OZ CLIKSEAL STRL BL (MISCELLANEOUS) ×2 IMPLANT
CONT SPEC PATH 64OZ SNAP LID (MISCELLANEOUS) ×2 IMPLANT
GLOVE BIOGEL PI IND STRL 7.0 (GLOVE) IMPLANT
GLOVE BIOGEL PI INDICATOR 7.0 (GLOVE) ×9
GLOVE ECLIPSE 7.0 STRL STRAW (GLOVE) ×6 IMPLANT
GOWN STRL REUS W/TWL LRG LVL3 (GOWN DISPOSABLE) ×7 IMPLANT
KIT BERKELEY 2ND TRIMESTER 1/2 (COLLECTOR) ×3 IMPLANT
NS IRRIG 1000ML POUR BTL (IV SOLUTION) ×3 IMPLANT
PACK VAGINAL MINOR WOMEN LF (CUSTOM PROCEDURE TRAY) ×3 IMPLANT
PAD OB MATERNITY 4.3X12.25 (PERSONAL CARE ITEMS) ×3 IMPLANT
PAD PREP 24X48 CUFFED NSTRL (MISCELLANEOUS) ×3 IMPLANT
TOWEL OR 17X24 6PK STRL BLUE (TOWEL DISPOSABLE) ×6 IMPLANT
TUBE VACURETTE 2ND TRIMESTER (CANNULA) ×3 IMPLANT
VACURETTE 12 RIGID CVD (CANNULA) IMPLANT
VACURETTE 14MM CVD 1/2 BASE (CANNULA) ×2 IMPLANT
VACURETTE 16MM ASPIR CVD .5 (CANNULA) IMPLANT

## 2014-08-05 NOTE — Transfer of Care (Signed)
Immediate Anesthesia Transfer of Care Note  Patient: Glenda Weiss  Procedure(s) Performed: Procedure(s): DILATATION AND EVACUATION (D&E) 2ND TRIMESTER (N/A)  Patient Location: PACU  Anesthesia Type:General  Level of Consciousness: awake, alert  and oriented  Airway & Oxygen Therapy: Patient Spontanous Breathing and Patient connected to nasal cannula oxygen  Post-op Assessment: Report given to PACU RN and Post -op Vital signs reviewed and stable  Post vital signs: Reviewed and stable  Complications: No apparent anesthesia complications

## 2014-08-05 NOTE — Anesthesia Preprocedure Evaluation (Signed)
Anesthesia Evaluation  Patient identified by MRN, date of birth, ID band Patient awake    Reviewed: Allergy & Precautions, H&P , Patient's Chart, lab work & pertinent test results, reviewed documented beta blocker date and time   Airway Mallampati: II  TM Distance: >3 FB Neck ROM: full    Dental no notable dental hx.    Pulmonary former smoker,  breath sounds clear to auscultation  Pulmonary exam normal       Cardiovascular Rhythm:regular Rate:Normal     Neuro/Psych    GI/Hepatic   Endo/Other    Renal/GU      Musculoskeletal   Abdominal   Peds  Hematology  (+) anemia ,   Anesthesia Other Findings   Reproductive/Obstetrics                             Anesthesia Physical Anesthesia Plan  ASA: II  Anesthesia Plan:    Post-op Pain Management:    Induction: Intravenous  Airway Management Planned: LMA  Additional Equipment:   Intra-op Plan:   Post-operative Plan:   Informed Consent: I have reviewed the patients History and Physical, chart, labs and discussed the procedure including the risks, benefits and alternatives for the proposed anesthesia with the patient or authorized representative who has indicated his/her understanding and acceptance.   Dental Advisory Given and Dental advisory given  Plan Discussed with: CRNA and Surgeon  Anesthesia Plan Comments: (Discussed GA with LMA, possible sore throat, potential need to switch to ETT, N/V, pulmonary aspiration. Questions answered. )        Anesthesia Quick Evaluation

## 2014-08-05 NOTE — Anesthesia Postprocedure Evaluation (Signed)
  Anesthesia Post-op Note  Patient: Glenda Weiss  Procedure(s) Performed: Procedure(s): DILATATION AND EVACUATION (D&E) 2ND TRIMESTER (N/A) Patient is awake and responsive. Pain and nausea are reasonably well controlled. Vital signs are stable and clinically acceptable. Oxygen saturation is clinically acceptable. There are no apparent anesthetic complications at this time. Patient is ready for discharge.

## 2014-08-05 NOTE — Op Note (Signed)
NAMLanice Shirts:  Glenda Weiss, Glenda Weiss       ACCOUNT NO.:  [id number]  MEDICAL RECORD NO.:  [id number]  LOCATION:  [facility]                          FACILITY:  [facility]  PHYSICIAN:  Malva LimesMark Anderson, M.D.    DATE OF BIRTH:  Feb 19, 1981  DATE OF PROCEDURE: DATE OF DISCHARGE:                              OPERATIVE REPORT   PREOPERATIVE DIAGNOSES: 1. Intrauterine pregnancy at 15 weeks estimated gestational age. 2. Down syndrome, confirmed on CVS.  POSTOPERATIVE DIAGNOSES: 1. Intrauterine pregnancy at 15 weeks estimated gestational age. 2. Down syndrome, confirmed on CVS.  PROCEDURE:  Dilation and evacuation.  SURGEON:  Malva LimesMark Anderson, M.D.  ANESTHESIA:  General and local.  ANTIBIOTICS:  Ancef 2 g.  ESTIMATED BLOOD LOSS:  100 mL.  SPECIMENS:  Products of conception sent to Pathology.  DRAINS:  Red rubber catheter to bladder.  COMPLICATIONS:  None.  PROCEDURE IN DETAIL:  The patient was taken to the operating room, where she was placed in dorsal supine position and general anesthetic was administered without difficulty.  She was then placed in dorsal lithotomy position.  She was prepped and draped in the usual fashion for this procedure.  An exam under anesthesia revealed an anteverted uterus consistent with 15 weeks estimated gestational age.  The patient had no adnexal masses.  A sterile speculum was placed in the vagina.  A 20 mL of 1% lidocaine was used for paracervical block.  A single-tooth tenaculum was applied to the anterior cervical lip.  The cervix was serially dilated and a 14-French suction curette was placed into the uterine cavity.  Products of conception were withdrawn with suction and forceps.  A sharp curettage was then performed followed by repeat suction.  The patient was given Toradol and Methergine in the OR.  She was taken to recovery room in stable condition.  Instrument and lap counts were correct x2.  The patient's blood type is Rh positive; and therefore, no RhoGAM is  indicated.  The patient will be discharged to home with Methergine and Percocet.          ______________________________ Malva LimesMark Anderson, M.D.     MA/MEDQ  D:  08/05/2014  T:  08/05/2014  Job:  [id number]

## 2014-08-05 NOTE — H&P (Signed)
Pt is a 33 y/o pt of Dr. Henderson CloudHorvath who presents to the OR for a D&E for down's Snydrome. [facility] Genetic studies confirmed Downs syndrome. The pt elects to have a D&E. She is now 15 weeks. PE: VSSAF         HEENT-wnl         ABD-non tender, no masses        Pelvic-defered to OR IMP/ IUP at 15 weeks with downs syndrome Plan/ D&E

## 2014-08-05 NOTE — Discharge Instructions (Signed)
DISCHARGE INSTRUCTIONS: D&E The following instructions have been prepared to help you care for yourself upon your return home.  MAY TAKE IBUPROFEN (MOTRIN, ADVIL) OR ALEVE AFTER 4:00 PM FOR CRAMPS!!   Personal hygiene:  Use sanitary pads for vaginal drainage, not tampons.  Shower the day after your procedure.  NO tub baths, pools or Jacuzzis for 2-3 weeks.  Wipe front to back after using the bathroom.  Activity and limitations:  Do NOT drive or operate any equipment for 24 hours. The effects of anesthesia are still present and drowsiness may result.  Do NOT rest in bed all day.  Walking is encouraged.  Walk up and down stairs slowly.  You may resume your normal activity in one to two days or as indicated by your physician.  Sexual activity: NO intercourse for at least 2 weeks after the procedure, or as indicated by your physician.  Diet: Eat a light meal as desired this evening. You may resume your usual diet tomorrow.  Return to work: You may resume your work activities in one to two days or as indicated by your doctor.  What to expect after your surgery: Expect to have vaginal bleeding/discharge for 2-3 days and spotting for up to 10 days. It is not unusual to have soreness for up to 1-2 weeks. You may have a slight burning sensation when you urinate for the first day. Mild cramps may continue for a couple of days. You may have a regular period in 2-6 weeks.  Call your doctor for any of the following:  Excessive vaginal bleeding, saturating and changing one pad every hour.  Inability to urinate 6 hours after discharge from hospital.  Pain not relieved by pain medication.  Fever of 100.4 F or greater.  Unusual vaginal discharge or odor.   Call for an appointment:    Patients signature: ______________________  Nurses signature ________________________  Support person's signature_______________________

## 2014-08-06 ENCOUNTER — Encounter (HOSPITAL_COMMUNITY): Payer: Self-pay | Admitting: Obstetrics and Gynecology

## 2015-05-19 ENCOUNTER — Encounter (HOSPITAL_COMMUNITY): Payer: Self-pay | Admitting: *Deleted

## 2016-03-09 LAB — OB RESULTS CONSOLE HEPATITIS B SURFACE ANTIGEN: HEP B S AG: NEGATIVE

## 2016-03-09 LAB — HM PAP SMEAR: HM Pap smear: NEGATIVE

## 2016-03-09 LAB — OB RESULTS CONSOLE RPR: RPR: NONREACTIVE

## 2016-03-09 LAB — OB RESULTS CONSOLE GC/CHLAMYDIA
Chlamydia: NEGATIVE
GC PROBE AMP, GENITAL: NEGATIVE

## 2016-03-09 LAB — OB RESULTS CONSOLE ABO/RH: RH TYPE: POSITIVE

## 2016-03-09 LAB — RESULTS CONSOLE HPV: CHL HPV: NEGATIVE

## 2016-03-09 LAB — OB RESULTS CONSOLE RUBELLA ANTIBODY, IGM: RUBELLA: IMMUNE

## 2016-03-09 LAB — OB RESULTS CONSOLE HIV ANTIBODY (ROUTINE TESTING): HIV: NONREACTIVE

## 2016-03-09 LAB — OB RESULTS CONSOLE ANTIBODY SCREEN: ANTIBODY SCREEN: NEGATIVE

## 2016-03-14 ENCOUNTER — Encounter (HOSPITAL_COMMUNITY): Payer: Self-pay

## 2016-03-16 ENCOUNTER — Other Ambulatory Visit (HOSPITAL_COMMUNITY): Payer: Self-pay

## 2016-03-16 ENCOUNTER — Ambulatory Visit (HOSPITAL_COMMUNITY)
Admission: RE | Admit: 2016-03-16 | Discharge: 2016-03-16 | Disposition: A | Discharge status: Home / Self Care | Payer: BLUE CROSS/BLUE SHIELD | Source: Ambulatory Visit | Attending: Obstetrics and Gynecology | Admitting: Obstetrics and Gynecology

## 2016-03-16 DIAGNOSIS — O09299 Supervision of pregnancy with other poor reproductive or obstetric history, unspecified trimester: Secondary | ICD-10-CM

## 2016-03-16 DIAGNOSIS — O09529 Supervision of elderly multigravida, unspecified trimester: Secondary | ICD-10-CM

## 2016-03-17 ENCOUNTER — Encounter (HOSPITAL_COMMUNITY): Payer: Self-pay

## 2016-03-17 DIAGNOSIS — O09299 Supervision of pregnancy with other poor reproductive or obstetric history, unspecified trimester: Secondary | ICD-10-CM | POA: Insufficient documentation

## 2016-03-17 DIAGNOSIS — Z3A12 12 weeks gestation of pregnancy: Secondary | ICD-10-CM | POA: Insufficient documentation

## 2016-03-17 NOTE — Progress Notes (Signed)
Genetic Counseling  High-Risk Gestation Note  Appointment Date:  03/17/2016 Referred By: Carrington Clamp, MD Date of Birth:  08-13-1981 Partner: Glenda Weiss   Pregnancy History: [id number]  Estimated Date of Delivery: 09/27/16 Estimated Gestational Age: [redacted]w[redacted]d Attending: Particia Nearing, MD   Glenda Weiss and her partner, Mr. Glenda Weiss, were seen for genetic counseling because of a maternal age of 35 y.o. and desire for chorionic villus sampling in current pregnancy.   In summary:  Discussed AMA and associated risk for fetal aneuploidy  Family history concerns: Previous pregnancy with prenatal diagnosis of Trisomy 21  Discussed that given this history, recurrence risk for fetal trisomy would be slightly increased above a priori age related risk  Discussed options for screening- patient declined screening options, interested in diagnostic testing  Discussed diagnostic testing options  [facility]uled [facility]/16 [facility] [place]  Amniocentesis-declined  Discussed carrier screening options  CF-declined  SMA-declined  Hemoglobinopathies- patient's 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   We reviewed chromosomes, nondisjunction, and the associated 1 in 54 risk for fetal aneuploidy related to a maternal age of 35 y.o. at [redacted]w[redacted]d gestation.  They were counseled that the risk for aneuploidy decreases as gestational age  increases, accounting for those pregnancies which spontaneously abort.  We specifically discussed Down syndrome (trisomy 62), trisomies 69 and 75, and sex chromosome aneuploidies (47,XXX and 47,XXY) including the common features and prognoses of each.   The couple's previous pregnancy in 2015 was diagnosed prenatally with trisomy 21, and termination of pregnancy was pursued. We reviewed that literature suggests that the risk of recurrence for a homo/heterotrisomy depends on the maternal age at the index pregnancy, the maternal age at the time of the subsequent pregnancy, and the specific trisomy [employer] [place] 2004).  Considering that Glenda Weiss was 35 years old at the time of that pregnancy and that she is currently 35 y.o., her adjusted risk for Down syndrome (homotrisomy) in the current pregnancy is ~1 in 62 (1 in 133 x the standard morbidity ratio of 1.6).  The overall risk for any aneuploidy, considering Glenda Weiss maternal age of 35 y.o. and her history of a previous pregnancy with trisomy 66, is estimated to be approximately 2.4%.     We briefly reviewed available screening options including First Screen, Quad screen, noninvasive prenatal screening (NIPS)/cell free DNA (cfDNA) screening, and detailed ultrasound.  They were counseled that screening tests are used to modify a patient's a priori risk for aneuploidy, typically based on age. This estimate provides a pregnancy specific risk assessment. We reviewed the overall benefits and limitations of screening options. They were also counseled regarding diagnostic testing via CVS (10-[redacted] weeks gestation) and amniocentesis (after [redacted] weeks gestation). We reviewed the approximate 1 in 100 risk for complications for CVS and the approximate 1 in 300-500 risk for complications from amniocentesis, including spontaneous pregnancy loss. We also discussed the associated risks for maternal cell contamination and confined placental mosaicism. We  discussed the possible results that the tests might provide including: positive, negative, unanticipated, and no result. They understand  that CVS and amniocentesis do not diagnose or rule out all birth defects or genetic conditions. Finally, they were counseled regarding the cost of each option and potential out of pocket expenses.  After consideration of all the options, they elected to proceed with CVS for [facility] for aneuploidy and karyotype, which is scheduled for 03/23/16 at the [facility] in [place].  We will contact the patient once the results are available and forward results to her OB office.     Glenda Weiss was provided with written information regarding cystic fibrosis (CF), spinal muscular atrophy (SMA) and hemoglobinopathies including the carrier frequency, availability of carrier screening and prenatal diagnosis if indicated.  In addition, we discussed that CF and hemoglobinopathies are routinely screened for as part of the [place] newborn screening panel.  After further discussion, she declined screening for CF and SMA.   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 Specifically this result reported hemoglobin A of 92.8%, hemoglobin A2 of 6.6%, and hemoglobin F of 0.6%.  From a recent CBC in her OB office, her MCV value was 67.9, and her MCH was 20.8.  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  [name] reported no known individuals with beta-thalassemia trait, and Mr. Glenda Weiss reported no known family history. Consanguinity between the couple was denied.   They were counseled that beta-thalassemia major is a genetic condition characterized by reduced synthesis of the beta subunit of hemoglobin (beta globin). We reviewed that  hemoglobin is a protein in red blood cells that carries oxygen to the body's organs. There are multiple types of hemoglobin, and these are comprised of different subunits.  The primary adult hemoglobin, denoted as hemoglobin A (Hb A) is made up of two alpha and two beta globin units. Individuals who have beta-thalassemia major have changes within the genes that code for beta globin. The resulting imbalance in the ratio of the alpha chains to the beta chains causes the underlying pathology. In beta-thalassemia, the alpha chains are produced in relative excess. Therefore, because of the absence of the beta chains with which to form a tetramer, the excess alpha chains will precipitate in the cell damaging the membrane and leading to premature RBC destruction. This results in hypochromic, microcytic anemia. The anemia is severe and typically leads to hepatosplenomegaly for individuals with beta thalassemia.  Because the beta chain is not used for hemoglobin production during fetal life, the onset of symptoms in beta-thalassemia is not until a few months of life. Beta thalassemia major is typically treated with regular transfusions and chelation therapy, aimed at reducing transfusion iron overload.  Without treatment, individuals with beta-thalassemia major have failure to thrive, feeding problems, and a shortened lifespan. Elevation of hemoglobin A2 (alpha2/delta2) occurs uniquely in beta-thalassemia carriers, and this is what is typically found on hemoglobin electrophoresis for beta thalassemia carriers. Continued production of the delta chains allows for tetramer formation between the alpha and delta chains. Beta-thalassemia produces a variable phenotype dependent upon the severity of the mutations.    They were counseled that beta-thalassemia is inherited in an autosomal recessive manner, and occurs when both copies of the beta hemoglobin gene are changed. Typically, one abnormal beta globin gene is inherited from  each parent. We discussed that beta thalassemia trait can be inherited with other beta globin chain variants, such as  sickle cell trait (Hb AS) to also cause other variant hemoglobinopathies, such as sickle beta-thalassemia. Beta-thalassemia minor occurs when an individual has one altered copy of the beta globin gene and one typical working copy of the beta globin gene.  This is also referred to as being a "carrier" of beta-thalassemia.  Carriers of recessive conditions typically do not have symptoms related to the condition, because they still have one functioning copy of the gene, and thus some production of the typical protein coded for by that gene.   Given the recessive inheritance, we discussed the importance of understanding the carrier status of the father of the pregnancy in order to accurately predict the risk of beta-thalassemia or other hemoglobinopathy in the fetus. We discussed the option of hemoglobin electrophoresis with a complete blood count and serum ferritin levels to determine whether he has beta-thalassemia trait, or any hemoglobin variant (including sickle cell trait). Mr. Glenda BlazingSmith declined carrier screening at this time. Additionally, we discussed the option of molecular testing of the HBB gene for [name] to confirm beta thalassemia carrier status. If both parents are identified to be carriers, and the molecular change is identified, prenatal diagnosis would be available. We also reviewed the availability of newborn screening in [place] for hemoglobinopathies. [name] declined molecular testing for HBB gene for herself at this time.   Both family histories were reviewed and updated from their visit in the previous 2015 pregnancy. The family histories were found to be noncontributory for updated regarding birth defects, intellectual disability, and known genetic conditions. Without further information regarding the provided family history, an accurate  genetic risk cannot be calculated. Further genetic counseling is warranted if more information is obtained.  Ms. Armie Glab-Seydou denied exposure to environmental toxins or chemical agents. She denied the use of alcohol, tobacco or street drugs. She denied significant viral illnesses during the course of her pregnancy. Her medical and surgical histories were noncontributory.   I counseled this couple regarding the above risks and available options. Janit PaganLauren Loffredo, [employer] genetic counseling student, assisted with genetic counseling under my direct supervision. The approximate face-to-face time with the genetic counselor was 30 minutes.  Quinn PlowmanKaren Laykin Rainone, MS,  Certified Genetic Counselor 03/17/2016

## 2016-03-21 ENCOUNTER — Ambulatory Visit (HOSPITAL_COMMUNITY): Payer: Self-pay

## 2016-03-21 ENCOUNTER — Encounter (HOSPITAL_COMMUNITY): Payer: Self-pay

## 2016-03-24 ENCOUNTER — Other Ambulatory Visit (HOSPITAL_COMMUNITY): Payer: Self-pay

## 2016-03-24 ENCOUNTER — Telehealth (HOSPITAL_COMMUNITY): Payer: Self-pay | Admitting: MS"

## 2016-03-24 NOTE — Telephone Encounter (Signed)
Called Glenda Weiss to discuss the preliminary FISH results from her CVS.  We reviewed that these are within normal limits. Fetal sex was not disclosed at this time, per patient's request.  We again discussed the limitations of FISH and that final results are still pending and will be available in 1-2 weeks.  Patient reported that she has not noticed any concerning symptoms or complications following the procedure. She inquired about being able to swim, given that she had a transcervical CVS. Per Dr. Clarisa FlingBrost, who is the attending MFM today, patient should not swim until at least a week following the procedure, pending presence or absence of symptoms.  All questions were answered to her satisfaction, she was encouraged to call with additional questions or concerns.  Quinn PlowmanKaren Sherra Kimmons, MS Patent attorneyCertified Genetic Counselor

## 2016-04-03 ENCOUNTER — Other Ambulatory Visit (HOSPITAL_COMMUNITY): Payer: Self-pay

## 2016-04-07 ENCOUNTER — Telehealth (HOSPITAL_COMMUNITY): Payer: Self-pay | Admitting: MS"

## 2016-04-07 NOTE — Telephone Encounter (Signed)
Patient called to inquire about final results of CVS. Patient identified by name and DOB. Reviewed that final karyotype is consistent with normal chromosomes (46, XY). All questions answered to her satisfaction at this time.   Glenda Weiss [name] 04/07/2016 12:02 PM

## 2016-09-04 NOTE — L&D Delivery Note (Signed)
Delivery Note At 1:34 PM a viable and healthy female was delivered via Vaginal, Spontaneous Delivery (Presentation: Occiput; Anterior ).  APGAR: 8, 9; weight 7 lb 9.9 oz (3455 g).   Placenta status: spontaneous, intact.  Cord: 3V  Anesthesia:   Episiotomy: None Lacerations: None Suture Repair: NA Est. Blood Loss (mL): 200  Mom to postpartum.  Baby to Couplet care / Skin to Skin.  Demaris Bousquet H. 09/25/2016, 10:28 AM

## 2016-09-05 LAB — OB RESULTS CONSOLE GBS: GBS: NEGATIVE

## 2016-09-13 ENCOUNTER — Inpatient Hospital Stay (EMERGENCY_DEPARTMENT_HOSPITAL)
Admission: AD | Admit: 2016-09-13 | Discharge: 2016-09-13 | Disposition: A | Discharge status: Home / Self Care | Payer: BLUE CROSS/BLUE SHIELD | Source: Ambulatory Visit | Attending: Obstetrics and Gynecology | Admitting: Obstetrics and Gynecology

## 2016-09-13 ENCOUNTER — Encounter (HOSPITAL_COMMUNITY): Payer: Self-pay | Admitting: *Deleted

## 2016-09-13 DIAGNOSIS — Z3A37 37 weeks gestation of pregnancy: Secondary | ICD-10-CM | POA: Diagnosis not present

## 2016-09-13 DIAGNOSIS — N898 Other specified noninflammatory disorders of vagina: Secondary | ICD-10-CM | POA: Diagnosis not present

## 2016-09-13 DIAGNOSIS — O4292 Full-term premature rupture of membranes, unspecified as to length of time between rupture and onset of labor: Secondary | ICD-10-CM | POA: Diagnosis not present

## 2016-09-13 DIAGNOSIS — O26893 Other specified pregnancy related conditions, third trimester: Secondary | ICD-10-CM | POA: Diagnosis not present

## 2016-09-13 DIAGNOSIS — O133 Gestational [pregnancy-induced] hypertension without significant proteinuria, third trimester: Secondary | ICD-10-CM

## 2016-09-13 LAB — CBC
HCT: 34.7 % — ABNORMAL LOW (ref 36.0–46.0)
Hemoglobin: 11.5 g/dL — ABNORMAL LOW (ref 12.0–15.0)
MCH: 22.6 pg — AB (ref 26.0–34.0)
MCHC: 33.1 g/dL (ref 30.0–36.0)
MCV: 68.3 fL — AB (ref 78.0–100.0)
PLATELETS: 154 10*3/uL (ref 150–400)
RBC: 5.08 MIL/uL (ref 3.87–5.11)
RDW: 15.8 % — ABNORMAL HIGH (ref 11.5–15.5)
WBC: 10.1 10*3/uL (ref 4.0–10.5)

## 2016-09-13 LAB — COMPREHENSIVE METABOLIC PANEL
ALT: 13 U/L — AB (ref 14–54)
ANION GAP: 9 (ref 5–15)
AST: 19 U/L (ref 15–41)
Albumin: 3.2 g/dL — ABNORMAL LOW (ref 3.5–5.0)
Alkaline Phosphatase: 129 U/L — ABNORMAL HIGH (ref 38–126)
BUN: 12 mg/dL (ref 6–20)
CO2: 20 mmol/L — AB (ref 22–32)
CREATININE: 0.51 mg/dL (ref 0.44–1.00)
Calcium: 9.1 mg/dL (ref 8.9–10.3)
Chloride: 104 mmol/L (ref 101–111)
Glucose, Bld: 74 mg/dL (ref 65–99)
Potassium: 3.6 mmol/L (ref 3.5–5.1)
SODIUM: 133 mmol/L — AB (ref 135–145)
Total Bilirubin: 0.5 mg/dL (ref 0.3–1.2)
Total Protein: 6.9 g/dL (ref 6.5–8.1)

## 2016-09-13 LAB — PROTEIN / CREATININE RATIO, URINE: CREATININE, URINE: 27 mg/dL

## 2016-09-13 NOTE — MAU Note (Signed)
Patient presents to mau with c/o discharge; describes as mucous like. Denies VB at this time. +FM. Reporting mild irregular contractions. States was 3cm in the office yesterday.

## 2016-09-13 NOTE — Discharge Instructions (Signed)
Reasons to return to MAU:  1.  Contractions are  5 minutes apart or less, each last 1 minute, these have been going on for 1-2 hours, and you cannot walk or talk during them 2.  You have a large gush of fluid, or a trickle of fluid that will not stop and you have to wear a pad 3.  You have bleeding that is bright red, heavier than spotting--like menstrual bleeding (spotting can be normal in early labor or after a check of your cervix) 4.  You do not feel the baby moving like he/she normally does    Hypertension During Pregnancy Hypertension, commonly called high blood pressure, is when the force of blood pumping through your arteries is too strong. Arteries are blood vessels that carry blood from the heart throughout the body. Hypertension during pregnancy can cause problems for you and your baby. Your baby may be born early (prematurely) or may not weigh as much as he or she should at birth. Very bad cases of hypertension during pregnancy can be life-threatening. Different types of hypertension can occur during pregnancy. These include:  Chronic hypertension. This happens when:  You have hypertension before pregnancy and it continues during pregnancy.  You develop hypertension before you are [redacted] weeks pregnant, and it continues during pregnancy.  Gestational hypertension. This is hypertension that develops after the 20th week of pregnancy.  Preeclampsia, also called toxemia of pregnancy. This is a very serious type of hypertension that develops only during pregnancy. It affects the whole body, and it can be very dangerous for you and your baby. Gestational hypertension and preeclampsia usually go away within 6 weeks after your baby is born. Women who have hypertension during pregnancy have a greater chance of developing hypertension later in life or during future pregnancies. What are the causes? The exact cause of hypertension is not known. What increases the risk? There are certain  factors that make it more likely for you to develop hypertension during pregnancy. These include:  Having hypertension during a previous pregnancy or prior to pregnancy.  Being overweight.  Being older than age 66.  Being pregnant for the first time or being pregnant with more than one baby.  Becoming pregnant using fertilization methods such as IVF (in vitro fertilization).  Having diabetes, kidney problems, or systemic lupus erythematosus.  Having a family history of hypertension. What are the signs or symptoms? Chronic hypertension and gestational hypertension rarely cause symptoms. Preeclampsia causes symptoms, which may include:  Increased protein in your urine. Your health care provider will check for this at every visit before you give birth (prenatal visit).  Severe headaches.  Sudden weight gain.  Swelling of the hands, face, legs, and feet.  Nausea and vomiting.  Vision problems, such as blurred or double vision.  Numbness in the face, arms, legs, and feet.  Dizziness.  Slurred speech.  Sensitivity to bright lights.  Abdominal pain.  Convulsions. How is this diagnosed? You may be diagnosed with hypertension during a routine prenatal exam. At each prenatal visit, you may:  Have a urine test to check for high amounts of protein in your urine.  Have your blood pressure checked. A blood pressure reading is recorded as two numbers, such as "120 over 80" (or 120/80). The first ("top") number is called the systolic pressure. It is a measure of the pressure in your arteries when your heart beats. The second ("bottom") number is called the diastolic pressure. It is a measure of the pressure in  your arteries as your heart relaxes between beats. Blood pressure is measured in a unit called mm Hg. A normal blood pressure reading is:  Systolic: below 120.  Diastolic: below 80. The type of hypertension that you are diagnosed with depends on your test results and when  your symptoms developed.  Chronic hypertension is usually diagnosed before 20 weeks of pregnancy.  Gestational hypertension is usually diagnosed after 20 weeks of pregnancy.  Hypertension with high amounts of protein in the urine is diagnosed as preeclampsia.  Blood pressure measurements that stay above 160 systolic, or above 110 diastolic, are signs of severe preeclampsia. How is this treated? Treatment for hypertension during pregnancy varies depending on the type of hypertension you have and how serious it is.  If you take medicines called ACE inhibitors to treat chronic hypertension, you may need to switch medicines. ACE inhibitors should not be taken during pregnancy.  If you have gestational hypertension, you may need to take blood pressure medicine.  If you are at risk for preeclampsia, your health care provider may recommend that you take a low-dose aspirin every day to prevent high blood pressure during your pregnancy.  If you have severe preeclampsia, you may need to be hospitalized so you and your baby can be monitored closely. You may also need to take medicine (magnesium sulfate) to prevent seizures and to lower blood pressure. This medicine may be given as an injection or through an IV tube.  In some cases, if your condition gets worse, you may need to deliver your baby early. Follow these instructions at home: Eating and drinking  Drink enough fluid to keep your urine clear or pale yellow.  Eat a healthy diet that is low in salt (sodium). Do not add salt to your food. Check food labels to see how much sodium a food or beverage contains. Lifestyle  Do not use any products that contain nicotine or tobacco, such as cigarettes and e-cigarettes. If you need help quitting, ask your health care provider.  Do not use alcohol.  Avoid caffeine.  Avoid stress as much as possible. Rest and get plenty of sleep. General instructions  Take over-the-counter and prescription  medicines only as told by your health care provider.  While lying down, lie on your left side. This keeps pressure off your baby.  While sitting or lying down, raise (elevate) your feet. Try putting some pillows under your lower legs.  Exercise regularly. Ask your health care provider what kinds of exercise are best for you.  Keep all prenatal and follow-up visits as told by your health care provider. This is important. Contact a health care provider if:  You have symptoms that your health care provider told you may require more treatment or monitoring, such as:  Fever.  Vomiting.  Headache. Get help right away if:  You have severe abdominal pain or vomiting that does not get better with treatment.  You suddenly develop swelling in your hands, ankles, or face.  You gain 4 lbs (1.8 kg) or more in 1 week.  You develop vaginal bleeding, or you have blood in your urine.  You do not feel your baby moving as much as usual.  You have blurred or double vision.  You have muscle twitching or sudden tightening (spasms).  You have shortness of breath.  Your lips or fingernails turn blue. This information is not intended to replace advice given to you by your health care provider. Make sure you discuss any questions you have  with your health care provider. Document Released: 05/09/2011 Document Revised: 03/10/2016 Document Reviewed: 02/04/2016 [employer] Interactive Patient Education  2017 [employer]

## 2016-09-13 NOTE — MAU Provider Note (Signed)
Chief Complaint:  Vaginal Discharge   First Provider Initiated Contact with Patient 09/13/16 1522      HPI: Glenda Weiss is a 35 y.o. G3P1011 at [redacted]w[redacted]d who presents to maternity admissions reporting thick mucus like vaginal discharge with brown tinge.  She reports she was seen in the office yesterday and was 3 cm dilated. She called the office today to report the discharge and was sent to MAU for evaluation. She has not tried any treatments, the discharge is intermittent and unchanged since onset today.  She also reports her BP was elevated for the first time in the pregnancy yesterday at her visit with Dr Henderson Cloud. She plans to return to the office on Friday for a BP check.  She denies h/a, epigastric pain, or visual disturbances.  She denies regular contractions. She reports good fetal movement, denies LOF, vaginal bleeding, vaginal itching/burning, urinary symptoms, dizziness, n/v, or fever/chills.    HPI  Past Medical History: Past Medical History:  Diagnosis Date  . Medical history non-contributory     Past obstetric history: OB History  Gravida Para Term Preterm AB Living  3 1 1   1 1   SAB TAB Ectopic Multiple Live Births    1          # Outcome Date GA Lbr Len/2nd Weight Sex Delivery Anes PTL Lv  3 Current           2 TAB 08/05/14 [redacted]w[redacted]d            Complications: Trisomy 21  1 Term 10/04/06              Past Surgical History: Past Surgical History:  Procedure Laterality Date  . DILATION AND EVACUATION N/A 08/05/2014   Procedure: DILATATION AND EVACUATION (D&E) 2ND TRIMESTER;  Surgeon: Levi Aland, MD;  Location: [facility];  Service: Gynecology;  Laterality: N/A;  . NO PAST SURGERIES      Family History: Family History  Problem Relation Age of Onset  . Drug abuse Neg Hx     Social History: Social History  Substance Use Topics  . Smoking status: Former Games developer  . Smokeless tobacco: Never Used  . Alcohol use No    Allergies: No Known Allergies  Meds:   Prescriptions Prior to Admission  Medication Sig Dispense Refill Last Dose  . Prenatal Vit-Fe Fumarate-FA (PRENATAL VITAMIN PO) Take 1 tablet by mouth every morning.    09/12/2016 at Unknown time  . methylergonovine (METHERGINE) 0.2 MG tablet Take 1 tablet (0.2 mg total) by mouth 4 (four) times daily. (Patient not taking: Reported on 09/13/2016) 5 tablet 0 Not Taking at Unknown time  . oxyCODONE-acetaminophen (PERCOCET) 10-325 MG per tablet Take 1 tablet by mouth every 4 (four) hours as needed for pain. (Patient not taking: Reported on 09/13/2016) 10 tablet 0 Not Taking at Unknown time    ROS:  Review of Systems  Constitutional: Negative for chills, fatigue and fever.  Eyes: Negative for visual disturbance.  Respiratory: Negative for shortness of breath.   Cardiovascular: Negative for chest pain.  Gastrointestinal: Negative for abdominal pain, nausea and vomiting.  Genitourinary: Positive for vaginal discharge. Negative for difficulty urinating, dysuria, flank pain, pelvic pain, vaginal bleeding and vaginal pain.  Neurological: Negative for dizziness and headaches.  Psychiatric/Behavioral: Negative.      I have reviewed patient's Past Medical Hx, Surgical Hx, Family Hx, Social Hx, medications and allergies.   Physical Exam   Patient Vitals for the past 24 hrs:  BP Temp  Temp src Pulse Resp SpO2  09/13/16 1547 122/87 - - 98 - -  09/13/16 1532 121/83 - - 97 - -  09/13/16 1523 129/93 - - 107 - -  09/13/16 1454 131/93 98.4 F (36.9 C) Oral 110 18 100 %   Constitutional: Well-developed, well-nourished female in no acute distress.  Cardiovascular: normal rate Respiratory: normal effort GI: Abd soft, non-tender, gravid appropriate for gestational age.  MS: Extremities nontender, no edema, normal ROM Neurologic: Alert and oriented x 4.  GU: Neg CVAT.  PELVIC EXAM: moderate amount thick mostly yellow mucus with scant ribbon of brown making visualization of cervix difficult  Dilation:  3 Effacement (%): 80 Presentation: Vertex Exam by:: Sharen Counter, CNM  FHT:  Baseline 140 , moderate variability, accelerations present, no decelerations Contractions: occasional, mild to palpation   Labs: Results for orders placed or performed during the hospital encounter of 09/13/16 (from the past 24 hour(s))  Protein / creatinine ratio, urine     Status: None   Collection Time: 09/13/16  2:40 PM  Result Value Ref Range   Creatinine, Urine 27.00 mg/dL   Total Protein, Urine <6 mg/dL   Protein Creatinine Ratio        0.00 - 0.15 mg/mg[Cre]  CBC     Status: Abnormal   Collection Time: 09/13/16  3:30 PM  Result Value Ref Range   WBC 10.1 4.0 - 10.5 K/uL   RBC 5.08 3.87 - 5.11 MIL/uL   Hemoglobin 11.5 (L) 12.0 - 15.0 g/dL   HCT 16.1 (L) 09.6 - 04.5 %   MCV 68.3 (L) 78.0 - 100.0 fL   MCH 22.6 (L) 26.0 - 34.0 pg   MCHC 33.1 30.0 - 36.0 g/dL   RDW 40.9 (H) 81.1 - 91.4 %   Platelets 154 150 - 400 K/uL  Comprehensive metabolic panel     Status: Abnormal   Collection Time: 09/13/16  3:30 PM  Result Value Ref Range   Sodium 133 (L) 135 - 145 mmol/L   Potassium 3.6 3.5 - 5.1 mmol/L   Chloride 104 101 - 111 mmol/L   CO2 20 (L) 22 - 32 mmol/L   Glucose, Bld 74 65 - 99 mg/dL   BUN 12 6 - 20 mg/dL   Creatinine, Ser 7.82 0.44 - 1.00 mg/dL   Calcium 9.1 8.9 - 95.6 mg/dL   Total Protein 6.9 6.5 - 8.1 g/dL   Albumin 3.2 (L) 3.5 - 5.0 g/dL   AST 19 15 - 41 U/L   ALT 13 (L) 14 - 54 U/L   Alkaline Phosphatase 129 (H) 38 - 126 U/L   Total Bilirubin 0.5 0.3 - 1.2 mg/dL   GFR calc non Af Amer >60 >60 mL/min   GFR calc Af Amer >60 >60 mL/min   Anion gap 9 5 - 15      Imaging:  No results found.  MAU Course/MDM: I have ordered labs and reviewed results.  NST reviewed Consult Dr Claiborne Billings with presentation, exam findings and test results.  No signs of active labor today with minimal contractions and no cervical change from office visit yesterday.  Discharge c/w mucus plug.    Borderline BP x 2 with normal BP in MAU afterwards.  Preeclampsia labs wnl with P/C ratio too low to report.  Dr Claiborne Billings reviewed BP yesterday in office and BPs 130s/80s, wnl.   Pt to keep BP check in office on Friday Preeclampsia and labor precautions/reasons to come to hospital reviewed Pt stable at time of  discharge.   Assessment: 1. Vaginal discharge during pregnancy in third trimester   2. Transient hypertension of pregnancy in third trimester     Plan: Discharge home Labor precautions and fetal kick counts  Follow-up Information    HORVATH,MICHELLE A, MD Follow up.   Specialty:  Obstetrics and Gynecology Why:  As scheduled on Friday, return to MAU with any emergencies or signs of active labor. Contact information: [address] [place] [address] [phone]          Allergies as of 09/13/2016   No Known Allergies     Medication List    STOP taking these medications   methylergonovine 0.2 MG tablet Commonly known as:  METHERGINE   oxyCODONE-acetaminophen 10-325 MG tablet Commonly known as:  PERCOCET     TAKE these medications   PRENATAL VITAMIN PO Take 1 tablet by mouth every morning.       Sharen CounterLisa Leftwich-Kirby Certified Nurse-Midwife 09/13/2016 4:55 PM

## 2016-09-14 ENCOUNTER — Inpatient Hospital Stay (HOSPITAL_COMMUNITY)
Admission: AD | Admit: 2016-09-14 | Discharge: 2016-09-16 | DRG: 775 | Disposition: A | Discharge status: Home / Self Care | Payer: BLUE CROSS/BLUE SHIELD | Source: Ambulatory Visit | Attending: Obstetrics and Gynecology | Admitting: Obstetrics and Gynecology

## 2016-09-14 ENCOUNTER — Inpatient Hospital Stay (HOSPITAL_COMMUNITY): Payer: BLUE CROSS/BLUE SHIELD | Admitting: Anesthesiology

## 2016-09-14 ENCOUNTER — Encounter (HOSPITAL_COMMUNITY): Payer: Self-pay

## 2016-09-14 DIAGNOSIS — D561 Beta thalassemia: Secondary | ICD-10-CM | POA: Diagnosis present

## 2016-09-14 DIAGNOSIS — R030 Elevated blood-pressure reading, without diagnosis of hypertension: Secondary | ICD-10-CM | POA: Diagnosis present

## 2016-09-14 DIAGNOSIS — O09299 Supervision of pregnancy with other poor reproductive or obstetric history, unspecified trimester: Secondary | ICD-10-CM

## 2016-09-14 DIAGNOSIS — O4292 Full-term premature rupture of membranes, unspecified as to length of time between rupture and onset of labor: Principal | ICD-10-CM | POA: Diagnosis present

## 2016-09-14 DIAGNOSIS — Z3A38 38 weeks gestation of pregnancy: Secondary | ICD-10-CM | POA: Diagnosis not present

## 2016-09-14 DIAGNOSIS — O26893 Other specified pregnancy related conditions, third trimester: Secondary | ICD-10-CM | POA: Diagnosis present

## 2016-09-14 DIAGNOSIS — O09523 Supervision of elderly multigravida, third trimester: Secondary | ICD-10-CM

## 2016-09-14 DIAGNOSIS — Z87891 Personal history of nicotine dependence: Secondary | ICD-10-CM

## 2016-09-14 LAB — COMPREHENSIVE METABOLIC PANEL
ALBUMIN: 3.3 g/dL — AB (ref 3.5–5.0)
ALK PHOS: 140 U/L — AB (ref 38–126)
ALT: 14 U/L (ref 14–54)
AST: 22 U/L (ref 15–41)
Anion gap: 11 (ref 5–15)
BILIRUBIN TOTAL: 0.4 mg/dL (ref 0.3–1.2)
BUN: 15 mg/dL (ref 6–20)
CALCIUM: 8.8 mg/dL — AB (ref 8.9–10.3)
CO2: 18 mmol/L — AB (ref 22–32)
Chloride: 97 mmol/L — ABNORMAL LOW (ref 101–111)
Creatinine, Ser: 0.63 mg/dL (ref 0.44–1.00)
GFR calc Af Amer: >60 mL/min (ref >60)
GFR calc non Af Amer: >60 mL/min (ref >60)
GLUCOSE: 104 mg/dL — AB (ref 65–99)
Potassium: 3.6 mmol/L (ref 3.5–5.1)
SODIUM: 126 mmol/L — AB (ref 135–145)
TOTAL PROTEIN: 6.8 g/dL (ref 6.5–8.1)

## 2016-09-14 LAB — CBC
HCT: 35.2 % — ABNORMAL LOW (ref 36.0–46.0)
Hemoglobin: 11.5 g/dL — ABNORMAL LOW (ref 12.0–15.0)
MCH: 22.2 pg — AB (ref 26.0–34.0)
MCHC: 32.7 g/dL (ref 30.0–36.0)
MCV: 67.8 fL — ABNORMAL LOW (ref 78.0–100.0)
PLATELETS: 126 10*3/uL — AB (ref 150–400)
RBC: 5.19 MIL/uL — ABNORMAL HIGH (ref 3.87–5.11)
RDW: 15.9 % — AB (ref 11.5–15.5)
WBC: 17.4 10*3/uL — ABNORMAL HIGH (ref 4.0–10.5)

## 2016-09-14 LAB — TYPE AND SCREEN
ABO/RH(D): B POS
Antibody Screen: NEGATIVE

## 2016-09-14 LAB — POCT FERN TEST: POCT Fern Test: POSITIVE

## 2016-09-14 LAB — RPR: RPR: NONREACTIVE

## 2016-09-14 MED ORDER — OXYTOCIN BOLUS FROM INFUSION
500.0000 mL | Freq: Once | INTRAVENOUS | Status: AC
  Administered 2016-09-14: 500 mL via INTRAVENOUS

## 2016-09-14 MED ORDER — LACTATED RINGERS IV SOLN: 500.0000 mL | INTRAVENOUS | Status: DC | PRN

## 2016-09-14 MED ORDER — OXYCODONE-ACETAMINOPHEN 5-325 MG PO TABS: 1.0000 | ORAL_TABLET | ORAL | Status: DC | PRN

## 2016-09-14 MED ORDER — LIDOCAINE HCL (PF) 1 % IJ SOLN
30.0000 mL | INTRAMUSCULAR | Status: DC | PRN
  Filled 2016-09-14: qty 30

## 2016-09-14 MED ORDER — FLEET ENEMA 7-19 GM/118ML RE ENEM: 1.0000 | ENEMA | RECTAL | Status: DC | PRN

## 2016-09-14 MED ORDER — TERBUTALINE SULFATE 1 MG/ML IJ SOLN
0.2500 mg | Freq: Once | INTRAMUSCULAR | Status: DC | PRN
  Filled 2016-09-14: qty 1

## 2016-09-14 MED ORDER — DIBUCAINE 1 % RE OINT
1.0000 "application " | TOPICAL_OINTMENT | RECTAL | Status: DC | PRN
  Administered 2016-09-14: 1 via RECTAL
  Filled 2016-09-14: qty 28

## 2016-09-14 MED ORDER — WITCH HAZEL-GLYCERIN EX PADS: 1.0000 "application " | MEDICATED_PAD | CUTANEOUS | Status: DC | PRN

## 2016-09-14 MED ORDER — ONDANSETRON HCL 4 MG/2ML IJ SOLN: 4.0000 mg | INTRAMUSCULAR | Status: DC | PRN

## 2016-09-14 MED ORDER — OXYCODONE-ACETAMINOPHEN 5-325 MG PO TABS: 2.0000 | ORAL_TABLET | ORAL | Status: DC | PRN

## 2016-09-14 MED ORDER — ACETAMINOPHEN 325 MG PO TABS: 650.0000 mg | ORAL_TABLET | ORAL | Status: DC | PRN

## 2016-09-14 MED ORDER — PHENYLEPHRINE 40 MCG/ML (10ML) SYRINGE FOR IV PUSH (FOR BLOOD PRESSURE SUPPORT)
80.0000 ug | PREFILLED_SYRINGE | INTRAVENOUS | Status: DC | PRN
  Filled 2016-09-14: qty 5

## 2016-09-14 MED ORDER — SIMETHICONE 80 MG PO CHEW: 80.0000 mg | CHEWABLE_TABLET | ORAL | Status: DC | PRN

## 2016-09-14 MED ORDER — ZOLPIDEM TARTRATE 5 MG PO TABS: 5.0000 mg | ORAL_TABLET | Freq: Every evening | ORAL | Status: DC | PRN

## 2016-09-14 MED ORDER — ONDANSETRON HCL 4 MG PO TABS: 4.0000 mg | ORAL_TABLET | ORAL | Status: DC | PRN

## 2016-09-14 MED ORDER — OXYTOCIN 40 UNITS IN LACTATED RINGERS INFUSION - SIMPLE MED
1.0000 m[IU]/min | INTRAVENOUS | Status: DC
  Administered 2016-09-14: 2 m[IU]/min via INTRAVENOUS
  Filled 2016-09-14: qty 1000

## 2016-09-14 MED ORDER — LIDOCAINE HCL (PF) 1 % IJ SOLN
INTRAMUSCULAR | Status: DC | PRN
  Administered 2016-09-14 (×2): 5 mL

## 2016-09-14 MED ORDER — OXYTOCIN 40 UNITS IN LACTATED RINGERS INFUSION - SIMPLE MED: 2.5000 [IU]/h | INTRAVENOUS | Status: DC

## 2016-09-14 MED ORDER — BENZOCAINE-MENTHOL 20-0.5 % EX AERO
1.0000 "application " | INHALATION_SPRAY | CUTANEOUS | Status: DC | PRN
  Administered 2016-09-14: 1 via TOPICAL
  Filled 2016-09-14: qty 56

## 2016-09-14 MED ORDER — ACETAMINOPHEN 325 MG PO TABS
650.0000 mg | ORAL_TABLET | ORAL | Status: DC | PRN
  Administered 2016-09-14: 650 mg via ORAL
  Filled 2016-09-14: qty 2

## 2016-09-14 MED ORDER — DIPHENHYDRAMINE HCL 25 MG PO CAPS: 25.0000 mg | ORAL_CAPSULE | Freq: Four times a day (QID) | ORAL | Status: DC | PRN

## 2016-09-14 MED ORDER — COCONUT OIL OIL: 1.0000 "application " | TOPICAL_OIL | Status: DC | PRN

## 2016-09-14 MED ORDER — EPHEDRINE 5 MG/ML INJ
10.0000 mg | INTRAVENOUS | Status: DC | PRN
  Filled 2016-09-14: qty 4

## 2016-09-14 MED ORDER — ONDANSETRON HCL 4 MG/2ML IJ SOLN: 4.0000 mg | Freq: Four times a day (QID) | INTRAMUSCULAR | Status: DC | PRN

## 2016-09-14 MED ORDER — PRENATAL MULTIVITAMIN CH
1.0000 | ORAL_TABLET | Freq: Every day | ORAL | Status: DC
  Administered 2016-09-15: 1 via ORAL
  Filled 2016-09-14: qty 1

## 2016-09-14 MED ORDER — TETANUS-DIPHTH-ACELL PERTUSSIS 5-2.5-18.5 LF-MCG/0.5 IM SUSP: 0.5000 mL | Freq: Once | INTRAMUSCULAR | Status: DC

## 2016-09-14 MED ORDER — PHENYLEPHRINE 40 MCG/ML (10ML) SYRINGE FOR IV PUSH (FOR BLOOD PRESSURE SUPPORT)
80.0000 ug | PREFILLED_SYRINGE | INTRAVENOUS | Status: DC | PRN
  Filled 2016-09-14: qty 5
  Filled 2016-09-14: qty 10

## 2016-09-14 MED ORDER — LACTATED RINGERS IV SOLN
INTRAVENOUS | Status: DC
  Administered 2016-09-14 (×2): via INTRAVENOUS

## 2016-09-14 MED ORDER — FENTANYL 2.5 MCG/ML BUPIVACAINE 1/10 % EPIDURAL INFUSION (WH - ANES)
14.0000 mL/h | INTRAMUSCULAR | Status: DC | PRN
  Administered 2016-09-14: 14 mL/h via EPIDURAL
  Filled 2016-09-14: qty 100

## 2016-09-14 MED ORDER — BUTORPHANOL TARTRATE 1 MG/ML IJ SOLN
1.0000 mg | INTRAMUSCULAR | Status: DC | PRN
  Filled 2016-09-14: qty 1

## 2016-09-14 MED ORDER — LACTATED RINGERS IV SOLN: 500.0000 mL | Freq: Once | INTRAVENOUS | Status: DC

## 2016-09-14 MED ORDER — METHYLERGONOVINE MALEATE 0.2 MG/ML IJ SOLN: 0.2000 mg | INTRAMUSCULAR | Status: DC | PRN

## 2016-09-14 MED ORDER — DIPHENHYDRAMINE HCL 50 MG/ML IJ SOLN: 12.5000 mg | INTRAMUSCULAR | Status: DC | PRN

## 2016-09-14 MED ORDER — METHYLERGONOVINE MALEATE 0.2 MG PO TABS: 0.2000 mg | ORAL_TABLET | ORAL | Status: DC | PRN

## 2016-09-14 MED ORDER — IBUPROFEN 600 MG PO TABS
600.0000 mg | ORAL_TABLET | Freq: Four times a day (QID) | ORAL | Status: DC
  Administered 2016-09-14 (×2): 600 mg via ORAL
  Filled 2016-09-14 (×6): qty 1

## 2016-09-14 MED ORDER — SENNOSIDES-DOCUSATE SODIUM 8.6-50 MG PO TABS
2.0000 | ORAL_TABLET | ORAL | Status: DC
  Administered 2016-09-14: 2 via ORAL
  Filled 2016-09-14 (×2): qty 2

## 2016-09-14 MED ORDER — SOD CITRATE-CITRIC ACID 500-334 MG/5ML PO SOLN: 30.0000 mL | ORAL | Status: DC | PRN

## 2016-09-14 NOTE — Anesthesia Pain Management Evaluation Note (Signed)
  CRNA Pain Management Visit Note  Patient: Glenda Weiss, 36 y.o., female  "Hello I am a member of the anesthesia team at [facility]. We have an anesthesia team available at all times to provide care throughout the hospital, including epidural management and anesthesia for C-section. I don't know your plan for the delivery whether it a natural birth, water birth, IV sedation, nitrous supplementation, doula or epidural, but we want to meet your pain goals."   1.Was your pain managed to your expectations on prior hospitalizations?   Yes   2.What is your expectation for pain management during this hospitalization?     Epidural  3.How can we help you reach that goal? unsure  Record the patient's initial score and the patient's pain goal.   Pain: 3  Pain Goal: 3 [facility] wants you to be able to say your pain was always managed very well.  Cephus ShellingBURGER,Mcdonald Reiling 09/14/2016

## 2016-09-14 NOTE — Anesthesia Procedure Notes (Signed)
Epidural Patient location during procedure: OB  Staffing Anesthesiologist: Wilba Mutz Performed: anesthesiologist   Preanesthetic Checklist Completed: patient identified, site marked, surgical consent, pre-op evaluation, timeout performed, IV checked, risks and benefits discussed and monitors and equipment checked  Epidural Patient position: sitting Prep: DuraPrep Patient monitoring: heart rate, continuous pulse ox and blood pressure Approach: right paramedian Location: L3-L4 Injection technique: LOR saline  Needle:  Needle type: Tuohy  Needle gauge: 17 G Needle length: 9 cm and 9 Needle insertion depth: 5 cm Catheter type: closed end flexible Catheter size: 20 Guage Catheter at skin depth: 10 cm Test dose: negative  Assessment Events: blood not aspirated, injection not painful, no injection resistance, negative IV test and no paresthesia  Additional Notes Patient identified. Risks/Benefits/Options discussed with patient including but not limited to bleeding, infection, nerve damage, paralysis, failed block, incomplete pain control, headache, blood pressure changes, nausea, vomiting, reactions to medication both or allergic, itching and postpartum back pain. Confirmed with bedside nurse the patient's most recent platelet count. Confirmed with patient that they are not currently taking any anticoagulation, have any bleeding history or any family history of bleeding disorders. Patient expressed understanding and wished to proceed. All questions were answered. Sterile technique was used throughout the entire procedure. Please see nursing notes for vital signs. Test dose was given through epidural needle and negative prior to continuing to dose epidural or start infusion. Warning signs of high block given to the patient including shortness of breath, tingling/numbness in hands, complete motor block, or any concerning symptoms with instructions to call for help. Patient was given  instructions on fall risk and not to get out of bed. All questions and concerns addressed with instructions to call with any issues.     

## 2016-09-14 NOTE — Lactation Note (Signed)
This note was copied from a baby's chart. Lactation Consultation Note  Patient Name: Boy Lanice Shirtsnh Woolsey-Seydou AVWUJ'WToday's Date: 09/14/2016 Reason for consult: Initial assessment   Initial consult with Exp BF mom of 2 hour old infant. Mom reports she BF her first child for 6 months. She fed breast milk/formula after she returned to work. She reports she is planning to give formula later but can give it in the hospital if needed..  Infant has not previously fed, he was laying on mom's lap and in a light sleep. Mom voiced she would like to feed him so she can take a nap. Infant woke up very easily and latched to left breast in the cross cradle hold. Infant latched readily with flanged lips, rhythmic suckles and intermittent swallows. Mom with firm compressible breasts and large nipples. Infant did not have difficulty latching to breast. Mom denied pain/pinching with feeding. Infant fed for 10 minutes and was still feeding when I left the room.  Discussed BF basics, positioning, hand expression, colostrum, milk coming to volume, and supply and demand. Enc mom to feed infant 8-12 x in 24 hours at first feeding cues. Feeding log given with instructions for use.   BF Resources Handout and [facility] reviewed, mom informed of IP/OP Services, BF Support Groups and LC phone #. Enc mom to call out to desk for feeding assistance as needed.    Maternal Data Formula Feeding for Exclusion: Yes Reason for exclusion: Mother's choice to formula and breast feed on admission Has patient been taught Hand Expression?: Yes Does the patient have breastfeeding experience prior to this delivery?: Yes  Feeding Feeding Type: Breast Fed Length of feed: 10 min  LATCH Score/Interventions Latch: Grasps breast easily, tongue down, lips flanged, rhythmical sucking.  Audible Swallowing: Spontaneous and intermittent  Type of Nipple: Everted at rest and after stimulation  Comfort (Breast/Nipple): Soft / non-tender      Hold (Positioning): Assistance needed to correctly position infant at breast and maintain latch.  LATCH Score: 9  Lactation Tools Discussed/Used WIC Program: No   Consult Status Consult Status: Follow-up Date: 09/15/16 Follow-up type: In-patient    Silas FloodSharon S Hice 09/14/2016, 4:30 PM

## 2016-09-14 NOTE — H&P (Signed)
Glenda Weiss is a 36 y.o. female presenting for leaking fluid  36 yo G3P2011 @ 38+1 presents for leaking fluid. The patient was seen in the hospital earlier in the day on 1/10 for vaginal discharge. She returned overnight with SROM  She was [facility] in the [facility] and was noted to have mildly elevated BPs 130/80.   SHe is AMA with a history of a prior T21 pregnancy. S/P CVS, 46XY  Pt with Beta Thalassemia OB History    Gravida Para Term Preterm AB Living   3 1 1   1 1    SAB TAB Ectopic Multiple Live Births     1           Past Medical History:  Diagnosis Date  . Medical history non-contributory    Past Surgical History:  Procedure Laterality Date  . DILATION AND EVACUATION N/A 08/05/2014   Procedure: DILATATION AND EVACUATION (D&E) 2ND TRIMESTER;  Surgeon: Levi AlandMark E Anderson, MD;  Location: [facility];  Service: Gynecology;  Laterality: N/A;  . NO PAST SURGERIES     Family History: family history is not on file. Social History:  reports that she quit smoking about 3 years ago. Her smoking use included Cigarettes. She has never used smokeless tobacco. She reports that she does not drink alcohol or use drugs.     Maternal Diabetes: No Genetic Screening: Normal Maternal Ultrasounds/Referrals: Normal Fetal Ultrasounds or other Referrals:  None Maternal Substance Abuse:  No Significant Maternal Medications:  None Significant Maternal Lab Results:  None Other Comments:  None  ROS History Dilation: 5 Effacement (%): 100 Station: 0 Exam by:: S Earl RNC Blood pressure 119/65, pulse (!) 142, temperature 98 F (36.7 C), temperature source Oral, resp. rate 16, height 5\' 2"  (1.575 m), weight 80.3 kg (177 lb), last menstrual period 12/22/2015, SpO2 99 %, unknown if currently breastfeeding. Exam Physical Exam  Prenatal labs: ABO, Rh: --/--/B POS (01/11 0516) Antibody: NEG (01/11 0516) Rubella: Immune (07/06 0000) RPR: Nonreactive (07/06 0000)  HBsAg:  Negative (07/06 0000)  HIV: Non-reactive (07/06 0000)  GBS: Negative (01/02 0000)   Assessment/Plan: 1) Admit 2) Continue Pitocin 3) Epidural on request   Glenda Weiss H. 09/14/2016, 8:56 AM

## 2016-09-14 NOTE — Anesthesia Preprocedure Evaluation (Signed)

## 2016-09-15 LAB — CBC
HEMATOCRIT: 26 % — AB (ref 36.0–46.0)
HEMOGLOBIN: 8.9 g/dL — AB (ref 12.0–15.0)
MCH: 23.1 pg — AB (ref 26.0–34.0)
MCHC: 34.2 g/dL (ref 30.0–36.0)
MCV: 67.4 fL — AB (ref 78.0–100.0)
Platelets: 125 10*3/uL — ABNORMAL LOW (ref 150–400)
RBC: 3.86 MIL/uL — ABNORMAL LOW (ref 3.87–5.11)
RDW: 15.9 % — AB (ref 11.5–15.5)
WBC: 13.2 10*3/uL — ABNORMAL HIGH (ref 4.0–10.5)

## 2016-09-15 NOTE — Anesthesia Postprocedure Evaluation (Signed)
Anesthesia Post Note  Patient: Glenda Weiss  Procedure(s) Performed: * No procedures listed *  Patient location during evaluation: Mother Baby Anesthesia Type: Epidural Level of consciousness: awake and alert Pain management: pain level controlled Vital Signs Assessment: post-procedure vital signs reviewed and stable Respiratory status: spontaneous breathing Cardiovascular status: blood pressure returned to baseline Postop Assessment: no headache, patient able to bend at knees, no backache, no signs of nausea or vomiting, epidural receding and adequate PO intake Anesthetic complications: no        Last Vitals:  Vitals:   09/14/16 2049 09/15/16 0536  BP: 130/75 115/70  Pulse: (!) 115 91  Resp: 18 18  Temp: 36.8 C 36.6 C    Last Pain:  Vitals:   09/15/16 0845  TempSrc:   PainSc: 0-No pain   Pain Goal:                 Salome ArntSterling, Delaine Hernandez Marie

## 2016-09-15 NOTE — Progress Notes (Signed)
Post Partum Day 1 Subjective: no complaints, up ad lib, voiding, tolerating PO, + flatus and breast feeding  Objective: Blood pressure 115/70, pulse 91, temperature 97.9 F (36.6 C), resp. rate 18, height 5\' 2"  (1.575 m), weight 80.3 kg (177 lb), last menstrual period 12/22/2015, SpO2 99 %, unknown if currently breastfeeding.  Physical Exam:  General: alert, cooperative and no distress Lochia: appropriate Uterine Fundus: firm perineum: healing well, no significant drainage, no dehiscence DVT Evaluation: No evidence of DVT seen on physical exam. Negative Homan's sign. No cords or calf tenderness.   Recent Labs  09/14/16 0516 09/15/16 0528  HGB 11.5* 8.9*  HCT 35.2* 26.0*    Assessment/Plan: Plan for discharge tomorrow, Breastfeeding and Circumcision prior to discharge   LOS: 1 day   Glenda Weiss Glenda Weiss 09/15/2016, 9:09 AM

## 2016-09-16 NOTE — Progress Notes (Signed)
Post Partum Day 2 Subjective: no complaints, up ad lib, voiding, tolerating PO, + flatus and breast feeding  Objective: Blood pressure 117/82, pulse 93, temperature 97.9 F (36.6 C), temperature source Oral, resp. rate 16, height 5\' 2"  (1.575 m), weight 80.3 kg (177 lb), last menstrual period 12/22/2015, SpO2 99 %, unknown if currently breastfeeding.  Physical Exam:  General: alert, cooperative and no distress Lochia: appropriate Uterine Fundus: firm perineum: healing well, no significant drainage, no dehiscence, no significant erythema DVT Evaluation: No evidence of DVT seen on physical exam. Negative Homan's sign. No cords or calf tenderness. No significant calf/ankle edema.   Recent Labs  09/14/16 0516 09/15/16 0528  HGB 11.5* 8.9*  HCT 35.2* 26.0*    Assessment/Plan: Discharge home and Breastfeeding   LOS: 2 days   Noretta Frier STACIA 09/16/2016, 10:02 AM

## 2016-09-16 NOTE — Progress Notes (Signed)
Pt raised her bed to be on eye level with the baby, requested it remain at that level

## 2016-09-16 NOTE — Lactation Note (Signed)
This note was copied from a baby's chart. Lactation Consultation Note  Patient Name: Boy Lanice Shirtsnh Langwell-Seydou EAVWU'JToday's Date: 09/16/2016 Reason for consult: Follow-up assessment Baby just coming off the breast. Mom reports baby is latching well but has some questions about how to tell if baby getting enough. Basic teaching reviewed with parents. Advised baby should be at breast 8-12 times in 24 hours and with feeding ques, nursing 15-20 minutes both breasts some feedings. Monitor voids/stools. Baby has had 5 voids/7 stools in past 24 hours. Has been to breast 7 times per parents record. Engorgement care reviewed if needed, advised of OP services and support group. Encouraged Mom to call if she would like assist before d/c today.   Maternal Data    Feeding Feeding Type: Breast Fed Length of feed: 15 min  LATCH Score/Interventions Latch: Repeated attempts needed to sustain latch, nipple held in mouth throughout feeding, stimulation needed to elicit sucking reflex. Intervention(s):  (widen latch )  Audible Swallowing: A few with stimulation Intervention(s): Hand expression;Skin to skin  Type of Nipple: Everted at rest and after stimulation  Comfort (Breast/Nipple): Soft / non-tender     Hold (Positioning): No assistance needed to correctly position infant at breast.  LATCH Score: 8  Lactation Tools Discussed/Used     Consult Status Consult Status: Complete Date: 09/16/16 Follow-up type: In-patient    Alfred LevinsGranger, Dinh Ayotte Ann 09/16/2016, 11:22 AM

## 2016-09-17 NOTE — Discharge Summary (Signed)
OB Discharge Summary     Patient Name: Glenda Weiss DOB: August 11, 1981 MRN: [id number]  Date of admission: 09/14/2016 Delivering MD: Waynard ReedsOSS, KENDRA   Date of discharge: 09/17/2016  Admitting diagnosis: 7383w1d, Contractions  Intrauterine pregnancy: 1883w1d     Secondary diagnosis:  Active Problems:   Indication for care in labor or delivery   Spontaneous vaginal delivery  Additional problems: None     Discharge diagnosis: Term Pregnancy Delivered                                                                                                Post partum procedures:none  Augmentation: AROM and Pitocin  Complications: None  Hospital course:  Onset of Labor With Vaginal Delivery     36 y.o. yo [id number] at 6383w1d was admitted in Active Labor on 09/14/2016. Patient had an uncomplicated labor course as follows:  Membrane Rupture Time/Date: 4:30 AM ,09/14/2016   Intrapartum Procedures: Episiotomy: None [1]                                         Lacerations:  None [1]  Patient had a delivery of a Viable infant. 09/14/2016  Information for the patient's newborn:  Margaretmary DysKanthavong-Seydou, Boy Tywanna [[id number]][[id number]]  Delivery Method: Vaginal, Spontaneous Delivery (Filed from Delivery Summary)    Pateint had an uncomplicated postpartum course.  She is ambulating, tolerating a regular diet, passing flatus, and urinating well. Patient is discharged home in stable condition on 09/17/16.    Physical exam Vitals:   09/14/16 2049 09/15/16 0536 09/15/16 1825 09/16/16 0500  BP: 130/75 115/70 117/74 117/82  Pulse: (!) 115 91 99 93  Resp: 18 18 18 16   Temp: 98.3 F (36.8 C) 97.9 F (36.6 C) 98.6 F (37 C) 97.9 F (36.6 C)  TempSrc: Oral  Oral Oral  SpO2:      Weight:      Height:       General: alert, cooperative and no distress Lochia: appropriate Uterine Fundus: firm perineum: healing well, no significant drainage, no dehiscence, no significant erythema DVT Evaluation: No evidence of DVT seen  on physical exam. Negative Homan's sign. No cords or calf tenderness. No significant calf/ankle edema. Labs: Lab Results  Component Value Date   WBC 13.2 (H) 09/15/2016   HGB 8.9 (L) 09/15/2016   HCT 26.0 (L) 09/15/2016   MCV 67.4 (L) 09/15/2016   PLT 125 (L) 09/15/2016   CMP Latest Ref Rng & Units 09/14/2016  Glucose 65 - 99 mg/dL 696(E104(H)  BUN 6 - 20 mg/dL 15  Creatinine 9.520.44 - 8.411.00 mg/dL 3.240.63  Sodium 401135 - 027145 mmol/L 126(L)  Potassium 3.5 - 5.1 mmol/L 3.6  Chloride 101 - 111 mmol/L 97(L)  CO2 22 - 32 mmol/L 18(L)  Calcium 8.9 - 10.3 mg/dL 2.5(D8.8(L)  Total Protein 6.5 - 8.1 g/dL 6.8  Total Bilirubin 0.3 - 1.2 mg/dL 0.4  Alkaline Phos 38 - 126 U/L 140(H)  AST 15 - 41 U/L 22  ALT 14 - 54  U/L 14    Discharge instruction: per After Visit Summary and "Baby and Me Booklet".  After visit meds:  Allergies as of 09/16/2016   No Known Allergies     Medication List    STOP taking these medications   acetaminophen 325 MG tablet Commonly known as:  TYLENOL     TAKE these medications   hydrocortisone cream 0.5 % Apply 1 application topically 2 (two) times daily as needed for itching.   PRENATAL VITAMIN PO Take 1 tablet by mouth every morning.       Diet: routine diet  Activity: Advance as tolerated. Pelvic rest for 6 weeks.   Outpatient follow up:4 weeks Follow up Appt:No future appointments. Follow up Visit:No Follow-up on file.  Postpartum contraception: Undecided  Newborn Data: Live born female  Birth Weight: 7 lb 9.9 oz (3455 g) APGAR: 8, 9  Baby Feeding: Breast Disposition:home with mother   09/17/2016 Wynonia Hazard, MD

## 2018-08-14 DIAGNOSIS — Z6828 Body mass index (BMI) 28.0-28.9, adult: Secondary | ICD-10-CM | POA: Diagnosis not present

## 2018-08-14 DIAGNOSIS — Z01419 Encounter for gynecological examination (general) (routine) without abnormal findings: Secondary | ICD-10-CM | POA: Diagnosis not present

## 2019-06-20 ENCOUNTER — Ambulatory Visit (INDEPENDENT_AMBULATORY_CARE_PROVIDER_SITE_OTHER): Payer: BC Managed Care – PPO | Admitting: Family Medicine

## 2019-06-20 ENCOUNTER — Other Ambulatory Visit: Payer: Self-pay

## 2019-06-20 ENCOUNTER — Telehealth: Payer: Self-pay

## 2019-06-20 ENCOUNTER — Encounter: Payer: Self-pay | Admitting: Family Medicine

## 2019-06-20 VITALS — BP 116/62 | HR 94 | Temp 98.6°F | Ht 62.25 in | Wt 154.1 lb

## 2019-06-20 DIAGNOSIS — Z0000 Encounter for general adult medical examination without abnormal findings: Secondary | ICD-10-CM | POA: Diagnosis not present

## 2019-06-20 DIAGNOSIS — R519 Headache, unspecified: Secondary | ICD-10-CM | POA: Diagnosis not present

## 2019-06-20 DIAGNOSIS — R7309 Other abnormal glucose: Secondary | ICD-10-CM

## 2019-06-20 DIAGNOSIS — E663 Overweight: Secondary | ICD-10-CM

## 2019-06-20 DIAGNOSIS — Z13220 Encounter for screening for lipoid disorders: Secondary | ICD-10-CM

## 2019-06-20 DIAGNOSIS — Z23 Encounter for immunization: Secondary | ICD-10-CM | POA: Diagnosis not present

## 2019-06-20 LAB — LIPID PANEL
Cholesterol: 168 mg/dL (ref 0–200)
HDL: 47.7 mg/dL (ref >39.00)
LDL Cholesterol: 81 mg/dL (ref 0–99)
NonHDL: 120.02
Total CHOL/HDL Ratio: 4
Triglycerides: 195 mg/dL — ABNORMAL HIGH (ref 0.0–149.0)
VLDL: 39 mg/dL (ref 0.0–40.0)

## 2019-06-20 LAB — COMPREHENSIVE METABOLIC PANEL
ALT: 13 U/L (ref 0–35)
AST: 13 U/L (ref 0–37)
Albumin: 4.3 g/dL (ref 3.5–5.2)
Alkaline Phosphatase: 42 U/L (ref 39–117)
BUN: 13 mg/dL (ref 6–23)
CO2: 29 mEq/L (ref 19–32)
Calcium: 9.3 mg/dL (ref 8.4–10.5)
Chloride: 102 mEq/L (ref 96–112)
Creatinine, Ser: 0.83 mg/dL (ref 0.40–1.20)
GFR: 76.2 mL/min (ref >60.00)
Glucose, Bld: 121 mg/dL — ABNORMAL HIGH (ref 70–99)
Potassium: 4 mEq/L (ref 3.5–5.1)
Sodium: 138 mEq/L (ref 135–145)
Total Bilirubin: 0.4 mg/dL (ref 0.2–1.2)
Total Protein: 7.6 g/dL (ref 6.0–8.3)

## 2019-06-20 NOTE — Progress Notes (Signed)
New Patient Office Visit  Subjective:  Patient ID: Glenda Weiss, female    DOB: 1980-10-04  Age: 38 y.o. MRN: [id number]  CC:  Chief Complaint  Patient presents with  . New Patient (Initial Visit)    Establish care - never had PCP.     HPI Glenda Weiss is 38 yo female who comes to the office to establish primary healthcare. Pt lives with mother, father, fiancePt has two children 48yo and 2.5 yo. 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  Last CPE- unknown Pap- 2018, next due in December 2020 Tdap- unknown  Flu- today Dental- twice year, last time 03/2019 Eye- 09/2018 Exercise- every other day walking 2-3 miles  Diet - regular   Anxiety -is probably related situation. Pt is selling her house now and moving to stay with her brother.  Migraines- happen once a week and can last 2-3 days. It starts on the back of the head and moves to the temple area. Pt takes Ibuprofen and tylenol for pain with moderate relief. She does not want to start on preventive medication for migraines at this point.  Past Medical History:  Diagnosis Date  . Medical history non-contributory   . Migraines     Past Surgical History:  Procedure Laterality Date  . DILATION AND EVACUATION N/A 08/05/2014   Procedure: DILATATION AND EVACUATION (D&E) 2ND TRIMESTER;  Surgeon: Ronnel Zuercher Millers, MD;  Location: [facility];  Service: Gynecology;  Laterality: N/A;  . NO PAST SURGERIES      Family History  Problem Relation Age of Onset  . Drug abuse Neg Hx     Social History   Socioeconomic History  . Marital status: Divorced    Spouse name: Not on file  . Number of children: Not on file  . Years of education: Not on file  . Highest education level: Not on file  Occupational History  . Not on file  Social Needs  . Financial resource strain: Not on file  . Food insecurity    Worry: Not on file    Inability: Not on file  . Transportation needs    Medical: Not on file    Non-medical: Not on file   Tobacco Use  . Smoking status: Current Some Day Smoker    Types: Cigarettes  . Smokeless tobacco: Never Used  . Tobacco comment: cig smoking is social - with alcohol  Substance and Sexual Activity  . Alcohol use: Yes    Comment: occasional - rare  . Drug use: No  . Sexual activity: Yes    Birth control/protection: None  Lifestyle  . Physical activity    Days per week: Not on file    Minutes per session: Not on file  . Stress: Not on file  Relationships  . Social Herbalist on phone: Not on file    Gets together: Not on file    Attends religious service: Not on file    Active member of club or organization: Not on file    Attends meetings of clubs or organizations: Not on file    Relationship status: Not on file  . Intimate partner violence    Fear of current or ex partner: Not on file    Emotionally abused: Not on file    Physically abused: Not on file    Forced sexual activity: Not on file  Other Topics Concern  . Not on file  Social History Narrative  . Not on file  ROS Review of Systems  Constitutional: Negative for activity change, chills, fatigue, fever and unexpected weight change.  HENT: Negative for congestion, dental problem, ear discharge, ear pain, mouth sores, rhinorrhea, sinus pain and sore throat.   Eyes: Negative for pain, discharge and visual disturbance.  Respiratory: Negative for cough, chest tightness, shortness of breath and wheezing.   Cardiovascular: Negative for chest pain, palpitations and leg swelling.  Gastrointestinal: Negative for abdominal distention, abdominal pain, blood in stool, constipation, diarrhea, nausea and vomiting.  Genitourinary: Negative for difficulty urinating, dysuria, frequency, hematuria and urgency.  Musculoskeletal: Negative.   Neurological: Positive for headaches. Negative for dizziness, syncope and numbness.       Vertigo  Hematological: Negative for adenopathy.  Psychiatric/Behavioral: The patient is  nervous/anxious.     Objective:   Today's Vitals: BP 116/62 (BP Location: Left Arm, Patient Position: Sitting, Cuff Size: Normal)   Pulse 94   Temp 98.6 F (37 C) (Temporal)   Ht 5' 2.25" (1.581 m)   Wt 69.9 kg   SpO2 98%   BMI 27.96 kg/m   Physical Exam Constitutional:      Appearance: Normal appearance. She is normal weight.  HENT:     Head: Normocephalic and atraumatic.     Right Ear: Tympanic membrane, ear canal and external ear normal. There is no impacted cerumen.     Left Ear: Tympanic membrane, ear canal and external ear normal. There is no impacted cerumen.     Nose: Nose normal. No congestion or rhinorrhea.     Mouth/Throat:     Mouth: Mucous membranes are moist.     Pharynx: Oropharynx is clear. No oropharyngeal exudate or posterior oropharyngeal erythema.  Eyes:     General: No scleral icterus.       Right eye: No discharge.     Extraocular Movements: Extraocular movements intact.     Conjunctiva/sclera: Conjunctivae normal.  Neck:     Musculoskeletal: Normal range of motion and neck supple.  Cardiovascular:     Rate and Rhythm: Normal rate and regular rhythm.     Pulses: Normal pulses.     Heart sounds: Normal heart sounds. No murmur. No friction rub. No gallop.   Pulmonary:     Effort: Pulmonary effort is normal.     Breath sounds: Normal breath sounds. No wheezing, rhonchi or rales.  Chest:     Chest wall: No tenderness.  Abdominal:     General: Bowel sounds are normal. There is no distension.     Palpations: Abdomen is soft.     Tenderness: There is no abdominal tenderness.  Musculoskeletal: Normal range of motion.     Right lower leg: No edema.     Left lower leg: No edema.  Skin:    General: Skin is warm and dry.  Neurological:     General: No focal deficit present.     Mental Status: She is alert and oriented to person, place, and time.  Psychiatric:        Mood and Affect: Mood normal.        Behavior: Behavior normal.        Thought  Content: Thought content normal.        Judgment: Judgment normal.     Assessment & Plan:  1. Overweight (BMI 25.0-29.9) - CBC with Differential - Comprehensive metabolic panel - Lipid Panel  2. Screening for lipid disorders - Lipid Panel  3. Need for influenza vaccination - Flu Vaccine QUAD 6+ mos PF IM (  Fluarix Quad PF)  4. Physical exam, annual Pt is in good health without any abnormal findings   Problem List Items Addressed This Visit    None      Outpatient Encounter Medications as of 06/20/2019  Medication Sig  . ibuprofen (ADVIL) 200 MG tablet 2 (two) times daily as needed.  . [DISCONTINUED] hydrocortisone cream 0.5 % Apply 1 application topically 2 (two) times daily as needed for itching.  . [DISCONTINUED] Prenatal Vit-Fe Fumarate-FA (PRENATAL VITAMIN PO) Take 1 tablet by mouth every morning.    No facility-administered encounter medications on file as of 06/20/2019.     Follow-up: in a year   Valentina Gulga N Marquay Kruse, RN

## 2019-06-20 NOTE — Progress Notes (Signed)
Subjective:    Patient ID: Glenda Weiss, female    DOB: Dec 14, 1980, 38 y.o.   MRN: [id number]  HPI Chief Complaint  Patient presents with  . New Patient (Initial Visit)    Establish care - never had PCP.    This is a 38 yo female who presents today to establish care. She is currently living with her parents, her fiance, her 36 yo and 2.5 yo. They are building a house and will be living with her brother until it is ready in about 8 months.   Last CPE- several years Pap- due 08/2019 Tdap- unsure, does not wish to have today Flu-today Eye-regular Dental-regular Exercise- walks several mile Diet- varied  Some increased stress with upcoming move.   Occasional migraines, 1x/ week, Alternates tylenol/ ibuprofen with relief.   Vertigo- has had in past, "coming back." Occasionally feels like she is flipping backward (as opposed to spinning side to side). Denies any runny nose, ear pain, ear pressure.   Past Medical History:  Diagnosis Date  . Medical history non-contributory   . Migraines    Past Surgical History:  Procedure Laterality Date  . DILATION AND EVACUATION N/A 08/05/2014   Procedure: DILATATION AND EVACUATION (D&E) 2ND TRIMESTER;  Surgeon: Olga Millers, MD;  Location: [facility];  Service: Gynecology;  Laterality: N/A;  . NO PAST SURGERIES     Family History  Problem Relation Age of Onset  . Drug abuse Neg Hx    Social History   Tobacco Use  . Smoking status: Current Some Day Smoker    Types: Cigarettes  . Smokeless tobacco: Never Used  . Tobacco comment: cig smoking is social - with alcohol  Substance Use Topics  . Alcohol use: Yes    Comment: occasional - rare  . Drug use: No      Review of Systems  Constitutional: Negative.   HENT: Negative.   Eyes: Negative.   Respiratory: Negative.   Cardiovascular: Negative.   Gastrointestinal: Negative.   Endocrine: Negative.   Genitourinary: Negative.   Musculoskeletal: Negative.   Skin: Negative.    Allergic/Immunologic: Negative.   Neurological: Positive for headaches.  Hematological: Negative.   Psychiatric/Behavioral: Positive for sleep disturbance (regularly awakened by her toddler). The patient is nervous/anxious.        Objective:   Physical Exam Vitals signs reviewed.  Constitutional:      General: She is not in acute distress.    Appearance: Normal appearance. She is normal weight. She is not ill-appearing, toxic-appearing or diaphoretic.  HENT:     Head: Normocephalic and atraumatic.  Eyes:     Conjunctiva/sclera: Conjunctivae normal.  Neck:     Musculoskeletal: Normal range of motion and neck supple.  Cardiovascular:     Rate and Rhythm: Normal rate and regular rhythm.     Heart sounds: Normal heart sounds.  Pulmonary:     Effort: Pulmonary effort is normal.     Breath sounds: Normal breath sounds.  Musculoskeletal:     Right lower leg: No edema.     Left lower leg: No edema.  Skin:    General: Skin is warm and dry.  Neurological:     Mental Status: She is alert and oriented to person, place, and time.  Psychiatric:        Mood and Affect: Mood normal.        Behavior: Behavior normal.        Thought Content: Thought content normal.  Judgment: Judgment normal.     BP 116/62 (BP Location: Left Arm, Patient Position: Sitting, Cuff Size: Normal)   Pulse 94   Temp 98.6 F (37 C) (Temporal)   Ht 5' 2.25" (1.581 m)   Wt 154 lb 1.9 oz (69.9 kg)   SpO2 98%   BMI 27.96 kg/m  Depression screen [facility] 2/9 06/20/2019 07/14/2014  Decreased Interest 0 0  Down, Depressed, Hopeless 0 0  PHQ - 2 Score 0 0       Assessment & Plan:  1. Physical exam, annual - Discussed and encouraged healthy lifestyle choices- adequate sleep, regular exercise, stress management and healthy food choices.   2. Overweight (BMI 25.0-29.9) - CBC with Differential - Comprehensive metabolic panel - Lipid Panel  3. Screening for lipid disorders - Lipid Panel  4. Need for  influenza vaccination - Flu Vaccine QUAD 6+ mos PF IM (Fluarix Quad PF)  5. Non intractable episodic headache, unspecified headache type - she is currently not interested in preventive medication, encouraged good hydration, adequate sleep, stress mangement    Olean Ree, FNP-BC  [facility], [facility]  06/20/2019 2:17 PM

## 2019-06-20 NOTE — Telephone Encounter (Signed)
Opened in error

## 2019-06-20 NOTE — Telephone Encounter (Signed)
Pt needs to set up a lab appt for a redraw due to her blood clotting fast the lab needs samples in a different collection vial.

## 2019-06-20 NOTE — Patient Instructions (Signed)
It was a pleasure to meet you today! I look forward to partnering with you for your health care needs  I will send you a mychart message with your lab results next week  Follow up in 1 year if labs are normal

## 2019-06-23 ENCOUNTER — Ambulatory Visit: Payer: BLUE CROSS/BLUE SHIELD | Admitting: Family Medicine

## 2019-06-23 ENCOUNTER — Other Ambulatory Visit: Payer: Self-pay | Admitting: Family Medicine

## 2019-06-23 DIAGNOSIS — Z0000 Encounter for general adult medical examination without abnormal findings: Secondary | ICD-10-CM

## 2019-06-23 NOTE — Addendum Note (Signed)
Addended by: Clarene Reamer B on: 06/23/2019 10:25 AM   Modules accepted: Orders

## 2019-06-24 ENCOUNTER — Other Ambulatory Visit (INDEPENDENT_AMBULATORY_CARE_PROVIDER_SITE_OTHER): Payer: BC Managed Care – PPO

## 2019-06-24 DIAGNOSIS — R7309 Other abnormal glucose: Secondary | ICD-10-CM | POA: Diagnosis not present

## 2019-06-24 DIAGNOSIS — Z0000 Encounter for general adult medical examination without abnormal findings: Secondary | ICD-10-CM | POA: Diagnosis not present

## 2019-06-24 LAB — CBC WITH DIFFERENTIAL/PLATELET
Basophils Absolute: 0 10*3/uL (ref 0.0–0.1)
Basophils Relative: 0.7 % (ref 0.0–3.0)
Eosinophils Absolute: 0.3 10*3/uL (ref 0.0–0.7)
Eosinophils Relative: 3.9 % (ref 0.0–5.0)
HCT: 38.6 % (ref 36.0–46.0)
Hemoglobin: 11.9 g/dL — ABNORMAL LOW (ref 12.0–15.0)
Lymphocytes Relative: 31.3 % (ref 12.0–46.0)
Lymphs Abs: 2.3 10*3/uL (ref 0.7–4.0)
MCHC: 30.8 g/dL (ref 30.0–36.0)
MCV: 68.1 fl — ABNORMAL LOW (ref 78.0–100.0)
Monocytes Absolute: 0.6 10*3/uL (ref 0.1–1.0)
Monocytes Relative: 7.9 % (ref 3.0–12.0)
Neutro Abs: 4.2 10*3/uL (ref 1.4–7.7)
Neutrophils Relative %: 56.2 % (ref 43.0–77.0)
Platelets: 210 10*3/uL (ref 150.0–400.0)
RBC: 5.67 Mil/uL — ABNORMAL HIGH (ref 3.87–5.11)
RDW: 15.3 % (ref 11.5–15.5)
WBC: 7.4 10*3/uL (ref 4.0–10.5)

## 2019-06-24 LAB — HEMOGLOBIN A1C: Hgb A1c MFr Bld: 6.4 % (ref 4.6–6.5)

## 2019-06-25 ENCOUNTER — Encounter: Payer: Self-pay | Admitting: Family Medicine

## 2019-06-25 DIAGNOSIS — D563 Thalassemia minor: Secondary | ICD-10-CM | POA: Insufficient documentation

## 2019-09-30 DIAGNOSIS — Z20822 Contact with and (suspected) exposure to covid-19: Secondary | ICD-10-CM | POA: Diagnosis not present

## 2020-01-01 ENCOUNTER — Telehealth: Payer: Self-pay

## 2020-01-01 NOTE — Telephone Encounter (Signed)
Pt said starting today at 1PM pt started with nausea and room was spinning around,and pt felt like she was going to pass out. Pt sat down and the fainty feeling left after 15 - 20' and the dizziness stopped after sitting for 20 - 30's. Pt has hx of vertigo on and off for 8 yrs.pt does not have CP,SOB,H/A or vision changes. Pt has had h/a on and off for several days but no H/A now. Pt has had watery diarrhea 5 - 6 times a day for the last 2 days. No other covid symptoms and pt still nauseated. Pt is going to have someone take her to either [facility] or [facility] in [place]. FYI to Harlin Heys FNP.

## 2020-01-02 ENCOUNTER — Other Ambulatory Visit: Payer: Self-pay

## 2020-01-02 ENCOUNTER — Encounter: Payer: Self-pay | Admitting: Emergency Medicine

## 2020-01-02 ENCOUNTER — Ambulatory Visit
Admission: EM | Admit: 2020-01-02 | Discharge: 2020-01-02 | Disposition: A | Discharge status: Home / Self Care | Payer: BC Managed Care – PPO

## 2020-01-02 DIAGNOSIS — R42 Dizziness and giddiness: Secondary | ICD-10-CM

## 2020-01-02 DIAGNOSIS — G43809 Other migraine, not intractable, without status migrainosus: Secondary | ICD-10-CM | POA: Diagnosis not present

## 2020-01-02 NOTE — ED Provider Notes (Signed)
Glenda Weiss    CSN: [id number] Arrival date & time: 01/02/20  6734      History   Chief Complaint Chief Complaint  Patient presents with  . Dizziness    HPI Glenda Weiss is a 39 y.o. female.   Patient reports that she has been having episodes of dizziness, and an increase in frequency of her migraines.  Reports that she takes ibuprofen for migraines and that this takes the edge off but does not get rid of the headache.  Reports that she began taking meclizine for vertigo yesterday.  Reports that she has plenty of this medication at home.  Reports that she has not spoken with her primary care provider about this issue in about 5 years.  Reports that she had an episode yesterday of feeling dizzy, and then when she sat down the room was spinning.  Denies that she passed out or lost consciousness.  Denies shortness of breath, nausea, vomiting, diarrhea, rash, fever, other symptoms.  ROS per HPI  The history is provided by the patient.  Dizziness   Past Medical History:  Diagnosis Date  . Medical history non-contributory   . Migraines     Patient Active Problem List   Diagnosis Date Noted  . Beta thalassemia trait 06/25/2019  . Indication for care in labor or delivery 09/14/2016  . Spontaneous vaginal delivery 09/14/2016  . 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 [redacted] weeks gestation of pregnancy   . AMA (advanced maternal age) multigravida 35+     Past Surgical History:  Procedure Laterality Date  . DILATION AND EVACUATION N/A 08/05/2014   Procedure: DILATATION AND EVACUATION (D&E) 2ND TRIMESTER;  Surgeon: Olga Millers, MD;  Location: [facility];  Service: Gynecology;  Laterality: N/A;  . NO PAST SURGERIES      OB History    Gravida  3   Para  2   Term  2   Preterm      AB  1   Living  2     SAB      TAB  1   Ectopic      Multiple  0   Live Births  1            Home Medications    Prior  to Admission medications   Medication Sig Start Date End Date Taking? Authorizing Provider  ibuprofen (ADVIL) 200 MG tablet 2 (two) times daily as needed.   Yes [provider]  KURVELO 0.15-30 MG-MCG tablet Take 1 tablet by mouth daily. 10/27/19  Yes [provider]    Family History Family History  Problem Relation Age of Onset  . Healthy Mother   . Healthy Father   . Drug abuse Neg Hx     Social History Social History   Tobacco Use  . Smoking status: Current Some Day Smoker    Types: Cigarettes  . Smokeless tobacco: Never Used  . Tobacco comment: cig smoking is social - with alcohol  Substance Use Topics  . Alcohol use: Yes    Comment: occasional - rare  . Drug use: No     Allergies   Patient has no known allergies.   Review of Systems Review of Systems  Neurological: Positive for dizziness.     Physical Exam Triage Vital Signs ED Triage Vitals  Enc Vitals Group     BP 01/02/20 0956 (!) 136/93     Pulse Rate 01/02/20 0956 (!) 106  Resp 01/02/20 0956 18     Temp 01/02/20 0956 99.7 F (37.6 C)     Temp Source 01/02/20 0956 Oral     SpO2 01/02/20 0956 98 %     Weight 01/02/20 0957 154 lb 1.6 oz (69.9 kg)     Height 01/02/20 0957 5' 2.25" (1.581 m)     Head Circumference --      Peak Flow --      Pain Score 01/02/20 0957 0     Pain Loc --      Pain Edu? --      Excl. in GC? --    No data found.  Updated Vital Signs BP (!) 136/93 (BP Location: Right Arm)   Pulse (!) 106   Temp 99.7 F (37.6 C) (Oral)   Resp 18   Ht 5' 2.25" (1.581 m)   Wt 154 lb 1.6 oz (69.9 kg)   LMP 12/02/2019 (Approximate)   SpO2 98%   Breastfeeding No   BMI 27.96 kg/m   Visual Acuity Right Eye Distance:   Left Eye Distance:   Bilateral Distance:    Right Eye Near:   Left Eye Near:    Bilateral Near:     Physical Exam Vitals and nursing note reviewed.  Constitutional:      General: She is not in acute distress.    Appearance: Normal  appearance. She is well-developed and normal weight. She is not ill-appearing.  HENT:     Head: Normocephalic and atraumatic.     Right Ear: Tympanic membrane normal.     Left Ear: Tympanic membrane normal.     Nose: Nose normal.     Mouth/Throat:     Mouth: Mucous membranes are moist.     Pharynx: Oropharynx is clear.  Eyes:     Extraocular Movements: Extraocular movements intact.     Conjunctiva/sclera: Conjunctivae normal.     Pupils: Pupils are equal, round, and reactive to light.  Cardiovascular:     Rate and Rhythm: Regular rhythm. Tachycardia present.     Heart sounds: Normal heart sounds. No murmur.  Pulmonary:     Effort: Pulmonary effort is normal. No respiratory distress.     Breath sounds: Normal breath sounds. No stridor. No wheezing, rhonchi or rales.  Chest:     Chest wall: No tenderness.  Abdominal:     General: Bowel sounds are normal.     Palpations: Abdomen is soft.     Tenderness: There is no abdominal tenderness.  Musculoskeletal:        General: Normal range of motion.     Cervical back: Normal range of motion and neck supple.  Skin:    General: Skin is warm and dry.     Capillary Refill: Capillary refill takes less than 2 seconds.  Neurological:     General: No focal deficit present.     Mental Status: She is alert and oriented to person, place, and time.  Psychiatric:        Mood and Affect: Mood normal.        Behavior: Behavior normal.        Thought Content: Thought content normal.      UC Treatments / Results  Labs (all labs ordered are listed, but only abnormal results are displayed) Labs Reviewed - No data to display  EKG   Radiology No results found.  Procedures Procedures (including critical care time)  Medications Ordered in UC Medications - No data to display  Initial  Impression / Assessment and Plan / UC Course  I have reviewed the triage vital signs and the nursing notes.  Pertinent labs & imaging results that were  available during my care of the patient were reviewed by me and considered in my medical decision making (see chart for details).    Migraine Vertigo: Presents with increased frequency and migraines as well as vertigo since yesterday.  Took meclizine with resolution of vertigo yesterday.  She is inquiring today about a CT scan of her head, states that she had 1 of these about 5 to 10 years ago when she last had issues with vertigo.  Discussed with patient that since she did not lose consciousness or hit her head, there is no reason for [place] to do a stat CT right now.  Instructed patient to follow-up with her primary care provider about her increase in frequency of her migraines and reappearance of her vertigo.  Instructed patient to take Excedrin Migraine for her headaches instead of just ibuprofen.  Also instructed patient that she may continue to use the meclizine as needed for vertigo.  Instructed patient to follow-up with primary care provider.  Instructed patient to follow-up with the ER for trouble swallowing, trouble breathing, other concerning symptoms.  Patient verbalized understanding is in agreement with treatment plan. Final Clinical Impressions(s) / UC Diagnoses   Final diagnoses:  Vertigo  Other migraine without status migrainosus, not intractable     Discharge Instructions     Try Excedrin Migraine for your migraines.  Continue to take Meclizine for dizziness.  Follow up with primary care to seek her opinion on your migraines being more frequent.     ED Prescriptions    None     PDMP not reviewed this encounter.   Moshe Cipro, NP 01/02/20 1045

## 2020-01-02 NOTE — ED Triage Notes (Signed)
Pt c/o dizziness and nausea. She states she felt like the room was moving and she was going to pass out. She felt nauseous afterwards from the dizziness. She has h/o vertigo.

## 2020-01-02 NOTE — Discharge Instructions (Signed)
Try Excedrin Migraine for your migraines.  Continue to take Meclizine for dizziness.  Follow up with primary care to seek her opinion on your migraines being more frequent.

## 2020-01-02 NOTE — Telephone Encounter (Signed)
Noted  

## 2020-01-05 ENCOUNTER — Telehealth: Payer: Self-pay | Admitting: Family Medicine

## 2020-01-05 NOTE — Telephone Encounter (Signed)
I reviewed ER notes. Patient needs to be evaluated in the office to determine next steps. Please schedule her an appointment.

## 2020-01-05 NOTE — Telephone Encounter (Signed)
Patient called today requesting a referral She stated that she was in the ED on 4/30 with vertigo symptoms. She wanted to make sure you have seen the ED notes. And she would like to have a CT Scan done and wanted to see if you could do a referral for the scan. Please advise

## 2020-01-07 NOTE — Telephone Encounter (Signed)
It is ok to schedule her for 4 pm on Friday, 01/09/2020. Please call and let her know.

## 2020-01-07 NOTE — Telephone Encounter (Signed)
I notified patient of Debbie's recommendations. Patient states that she cannot take anymore time off of work, and the soonest time she can be here is 4:00. Eunice Blase, would you be ok with doing a hosp f/u on Fri 5/7 at 4:00? Just wanted to make sure before I schedule this.

## 2020-01-07 NOTE — Telephone Encounter (Signed)
I left VM for patient letting her know that I scheduled this appt for Fri 5/7 at 4:00, and to call back with any issues.

## 2020-01-09 ENCOUNTER — Ambulatory Visit (INDEPENDENT_AMBULATORY_CARE_PROVIDER_SITE_OTHER): Payer: BC Managed Care – PPO | Admitting: Family Medicine

## 2020-01-09 ENCOUNTER — Other Ambulatory Visit: Payer: Self-pay

## 2020-01-09 ENCOUNTER — Encounter: Payer: Self-pay | Admitting: Family Medicine

## 2020-01-09 VITALS — BP 116/68 | HR 119 | Temp 97.8°F | Ht 62.0 in | Wt 157.1 lb

## 2020-01-09 DIAGNOSIS — R7303 Prediabetes: Secondary | ICD-10-CM | POA: Diagnosis not present

## 2020-01-09 DIAGNOSIS — G44229 Chronic tension-type headache, not intractable: Secondary | ICD-10-CM | POA: Diagnosis not present

## 2020-01-09 DIAGNOSIS — R42 Dizziness and giddiness: Secondary | ICD-10-CM

## 2020-01-09 DIAGNOSIS — G43009 Migraine without aura, not intractable, without status migrainosus: Secondary | ICD-10-CM

## 2020-01-09 MED ORDER — CYCLOBENZAPRINE HCL 10 MG PO TABS: 5.0000 mg | ORAL_TABLET | Freq: Every day | ORAL | 0 refills | Status: DC | PRN

## 2020-01-09 MED ORDER — AMITRIPTYLINE HCL 25 MG PO TABS: 25.0000 mg | ORAL_TABLET | Freq: Every day | ORAL | 5 refills | Status: DC

## 2020-01-09 NOTE — Progress Notes (Signed)
Subjective:    Patient ID: Glenda Weiss, female    DOB: 11-02-1980, 39 y.o.   MRN: [id number]  HPI Chief Complaint  Patient presents with  . Follow-up    follow up from urgent care for vertigo, no concerns.    This is a 39 yo female who presents today for follow up of urgent care visit for vertigo. She has been having more frequent migraines. Most days has a headache, has across eyes. Migraines on left side of neck, into left temple. TAkes ibuprofen. No identified triggers. Increased frequency over last 4-5 years, every 1-2 years. Sleeping ok. Drinking 4 bottles water, coffee, soda/ gatorade daily.   Was seen at [facility] 01/02/20 for vertigo, has had two other episodes years ago. No visual changes. No vertigo for past couple of days. Nothing for headache in last 2 days.  Relief with meclizine.  Does not recall any recent URI symptoms or problems with allergic rhinitis.  Migraine without aura. Starts at back of neck.  Moves above ear to left simple and is throbbing in quality.  Symptoms relieved with sleep and ibuprofen, she will sometimes awaken with headache.  Sleep is good.  Fluid intake good.  She is unable to identify any triggers.    Review of Systems    per HPI Objective:   Physical Exam Vitals reviewed.  Constitutional:      General: She is not in acute distress.    Appearance: Normal appearance. She is normal weight. She is not ill-appearing, toxic-appearing or diaphoretic.  HENT:     Head: Normocephalic and atraumatic.     Right Ear: Tympanic membrane, ear canal and external ear normal.     Left Ear: Ear canal and external ear normal.     Ears:     Comments: Left TM scarred.    Nose: Nose normal.     Mouth/Throat:     Mouth: Mucous membranes are moist.     Pharynx: Oropharynx is clear.  Eyes:     Extraocular Movements: Extraocular movements intact.     Conjunctiva/sclera: Conjunctivae normal.     Pupils: Pupils are equal, round, and reactive to light.  Cardiovascular:       Rate and Rhythm: Normal rate and regular rhythm.     Heart sounds: Normal heart sounds.  Pulmonary:     Effort: Pulmonary effort is normal.     Breath sounds: Normal breath sounds.  Musculoskeletal:     Cervical back: Normal range of motion and neck supple. No rigidity or tenderness.     Right lower leg: No edema.     Left lower leg: No edema.  Lymphadenopathy:     Cervical: No cervical adenopathy.  Skin:    General: Skin is warm and dry.  Neurological:     Mental Status: She is alert and oriented to person, place, and time.     Motor: No weakness.     Coordination: Coordination normal.     Gait: Gait normal.  Psychiatric:        Mood and Affect: Mood normal.        Behavior: Behavior normal.        Thought Content: Thought content normal.        Judgment: Judgment normal.       BP 116/68   Pulse (!) 119   Temp 97.8 F (36.6 C) (Tympanic)   Ht 5\' 2"  (1.575 m)   Wt 157 lb 1.6 oz (71.3 kg)   SpO2 98%  BMI 28.73 kg/m  Wt Readings from Last 3 Encounters:  01/09/20 157 lb 1.6 oz (71.3 kg)  01/02/20 154 lb 1.6 oz (69.9 kg)  06/20/19 154 lb 1.9 oz (69.9 kg)       Assessment & Plan:  1. Migraine without aura and without status migrainosus, not intractable -Discussed her different types of headaches and suggested treatments -Have asked her to follow-up with me if she is not better in a couple of weeks, can refer to neurology - cyclobenzaprine (FLEXERIL) 10 MG tablet; Take 0.5-1 tablets (5-10 mg total) by mouth daily as needed for muscle spasms (neck pain).  Dispense: 30 tablet; Refill: 0 - amitriptyline (ELAVIL) 25 MG tablet; Take 1 tablet (25 mg total) by mouth at bedtime.  Dispense: 30 tablet; Refill: 5  2. Chronic tension-type headache, not intractable -Encouraged adequate sleep, good fluid intake  3. Vertigo -This is resolved.  Discussed possible triggers.  Good relief with meclizine.  4. Prediabetes -Reports that she has not made any dietary changes.   Encouraged her to reduce sugars and starches and will recheck at annual visit in a couple of months.   Olean Ree, FNP-BC  [facility] at [facility], [facility]  01/09/2020 5:27 PM

## 2020-01-09 NOTE — Patient Instructions (Signed)
Good to see you today  I have sent in daily medication for headaches (amitryptiline) and as needed medication for migraines.   If not improved in 2 week, let me know. Can refer you to neurologist.    Migraine Headache A migraine headache is a very strong throbbing pain on one side or both sides of your head. This type of headache can also cause other symptoms. It can last from 4 hours to 3 days. Talk with your doctor about what things may bring on (trigger) this condition. What are the causes? The exact cause of this condition is not known. This condition may be triggered or caused by:  Drinking alcohol.  Smoking.  Taking medicines, such as: ? Medicine used to treat chest pain (nitroglycerin). ? Birth control pills. ? Estrogen. ? Some blood pressure medicines.  Eating or drinking certain products.  Doing physical activity. Other things that may trigger a migraine headache include:  Having a menstrual period.  Pregnancy.  Hunger.  Stress.  Not getting enough sleep or getting too much sleep.  Weather changes.  Tiredness (fatigue). What increases the risk?  Being 31-46 years old.  Being female.  Having a family history of migraine headaches.  Being Caucasian.  Having depression or anxiety.  Being very overweight. What are the signs or symptoms?  A throbbing pain. This pain may: ? Happen in any area of the head, such as on one side or both sides. ? Make it hard to do daily activities. ? Get worse with physical activity. ? Get worse around bright lights or loud noises.  Other symptoms may include: ? Feeling sick to your stomach (nauseous). ? Vomiting. ? Dizziness. ? Being sensitive to bright lights, loud noises, or smells.  Before you get a migraine headache, you may get warning signs (an aura). An aura may include: ? Seeing flashing lights or having blind spots. ? Seeing bright spots, halos, or zigzag lines. ? Having tunnel vision or blurred vision.  ? Having numbness or a tingling feeling. ? Having trouble talking. ? Having weak muscles.  Some people have symptoms after a migraine headache (postdromal phase), such as: ? Tiredness. ? Trouble thinking (concentrating). How is this treated?  Taking medicines that: ? Relieve pain. ? Relieve the feeling of being sick to your stomach. ? Prevent migraine headaches.  Treatment may also include: ? Having acupuncture. ? Avoiding foods that bring on migraine headaches. ? Learning ways to control your body functions (biofeedback). ? Therapy to help you know and deal with negative thoughts (cognitive behavioral therapy). Follow these instructions at home: Medicines  Take over-the-counter and prescription medicines only as told by your doctor.  Ask your doctor if the medicine prescribed to you: ? Requires you to avoid driving or using heavy machinery. ? Can cause trouble pooping (constipation). You may need to take these steps to prevent or treat trouble pooping:  Drink enough fluid to keep your pee (urine) pale yellow.  Take over-the-counter or prescription medicines.  Eat foods that are high in fiber. These include beans, whole grains, and fresh fruits and vegetables.  Limit foods that are high in fat and sugar. These include fried or sweet foods. Lifestyle  Do not drink alcohol.  Do not use any products that contain nicotine or tobacco, such as cigarettes, e-cigarettes, and chewing tobacco. If you need help quitting, ask your doctor.  Get at least 8 hours of sleep every night.  Limit and deal with stress. General instructions  Keep a journal to find out what may bring on your migraine headaches. For example, write down: ? What you eat and drink. ? How much sleep you get. ? Any change in what you eat or drink. ? Any change in your medicines.  If you have a migraine headache: ? Avoid things that make your symptoms worse, such as bright lights. ? It may help to lie  down in a dark, quiet room. ? Do not drive or use heavy machinery. ? Ask your doctor what activities are safe for you.  Keep all follow-up visits as told by your doctor. This is important. Contact a doctor if:  You get a migraine headache that is different or worse than others you have had.  You have more than 15 headache days in one month. Get help right away if:  Your migraine headache gets very bad.  Your migraine headache lasts longer than 72 hours.  You have a fever.  You have a stiff neck.  You have trouble seeing.  Your muscles feel weak or like you cannot control them.  You start to lose your balance a lot.  You start to have trouble walking.  You pass out (faint).  You have a seizure. Summary  A migraine headache is a very strong throbbing pain on one side or both sides of your head. These headaches can also cause other symptoms.  This condition may be treated with medicines and changes to your lifestyle.  Keep a journal to find out what may bring on your migraine headaches.  Contact a doctor if you get a migraine headache that is different or worse than others you have had.  Contact your doctor if you have more than 15 headache days in a month. This information is not intended to replace advice given to you by your health care provider. Make sure you discuss any questions you have with your health care provider. Document Revised: 12/13/2018 Document Reviewed: 10/03/2018 Elsevier Patient Education  2020 [employer].

## 2020-04-09 ENCOUNTER — Other Ambulatory Visit: Payer: Self-pay | Admitting: Family Medicine

## 2020-04-09 DIAGNOSIS — G43009 Migraine without aura, not intractable, without status migrainosus: Secondary | ICD-10-CM

## 2020-05-05 DIAGNOSIS — Z6829 Body mass index (BMI) 29.0-29.9, adult: Secondary | ICD-10-CM | POA: Diagnosis not present

## 2020-05-05 DIAGNOSIS — Z1231 Encounter for screening mammogram for malignant neoplasm of breast: Secondary | ICD-10-CM | POA: Diagnosis not present

## 2020-05-05 DIAGNOSIS — Z01419 Encounter for gynecological examination (general) (routine) without abnormal findings: Secondary | ICD-10-CM | POA: Diagnosis not present

## 2020-05-05 LAB — HM MAMMOGRAPHY

## 2020-05-17 ENCOUNTER — Other Ambulatory Visit: Payer: Self-pay | Admitting: *Deleted

## 2020-05-17 DIAGNOSIS — G43009 Migraine without aura, not intractable, without status migrainosus: Secondary | ICD-10-CM

## 2020-05-17 MED ORDER — AMITRIPTYLINE HCL 25 MG PO TABS: 25.0000 mg | ORAL_TABLET | Freq: Every day | ORAL | 0 refills | Status: DC

## 2020-06-10 ENCOUNTER — Encounter: Payer: Self-pay | Admitting: Family Medicine

## 2020-06-10 ENCOUNTER — Other Ambulatory Visit: Payer: Self-pay | Admitting: Family Medicine

## 2020-06-10 DIAGNOSIS — R7303 Prediabetes: Secondary | ICD-10-CM

## 2020-06-10 DIAGNOSIS — E663 Overweight: Secondary | ICD-10-CM

## 2020-06-14 ENCOUNTER — Other Ambulatory Visit: Payer: Self-pay

## 2020-06-14 ENCOUNTER — Other Ambulatory Visit (INDEPENDENT_AMBULATORY_CARE_PROVIDER_SITE_OTHER): Payer: BC Managed Care – PPO

## 2020-06-14 DIAGNOSIS — E663 Overweight: Secondary | ICD-10-CM

## 2020-06-14 DIAGNOSIS — R7303 Prediabetes: Secondary | ICD-10-CM | POA: Diagnosis not present

## 2020-06-14 LAB — CBC WITH DIFFERENTIAL/PLATELET
Basophils Absolute: 0.1 10*3/uL (ref 0.0–0.1)
Basophils Relative: 0.9 % (ref 0.0–3.0)
Eosinophils Absolute: 0.3 10*3/uL (ref 0.0–0.7)
Eosinophils Relative: 4.1 % (ref 0.0–5.0)
HCT: 39 % (ref 36.0–46.0)
Hemoglobin: 12.2 g/dL (ref 12.0–15.0)
Lymphocytes Relative: 27 % (ref 12.0–46.0)
Lymphs Abs: 2.1 10*3/uL (ref 0.7–4.0)
MCHC: 31.3 g/dL (ref 30.0–36.0)
MCV: 68.6 fl — ABNORMAL LOW (ref 78.0–100.0)
Monocytes Absolute: 0.6 10*3/uL (ref 0.1–1.0)
Monocytes Relative: 8.2 % (ref 3.0–12.0)
Neutro Abs: 4.6 10*3/uL (ref 1.4–7.7)
Neutrophils Relative %: 59.8 % (ref 43.0–77.0)
Platelets: 181 10*3/uL (ref 150.0–400.0)
RBC: 5.68 Mil/uL — ABNORMAL HIGH (ref 3.87–5.11)
RDW: 15 % (ref 11.5–15.5)
WBC: 7.7 10*3/uL (ref 4.0–10.5)

## 2020-06-14 LAB — COMPREHENSIVE METABOLIC PANEL
ALT: 13 U/L (ref 0–35)
AST: 14 U/L (ref 0–37)
Albumin: 4.1 g/dL (ref 3.5–5.2)
Alkaline Phosphatase: 41 U/L (ref 39–117)
BUN: 14 mg/dL (ref 6–23)
CO2: 28 mEq/L (ref 19–32)
Calcium: 9 mg/dL (ref 8.4–10.5)
Chloride: 104 mEq/L (ref 96–112)
Creatinine, Ser: 0.82 mg/dL (ref 0.40–1.20)
GFR: 88.99 mL/min (ref >60.00)
Glucose, Bld: 92 mg/dL (ref 70–99)
Potassium: 4.2 mEq/L (ref 3.5–5.1)
Sodium: 138 mEq/L (ref 135–145)
Total Bilirubin: 0.4 mg/dL (ref 0.2–1.2)
Total Protein: 7.5 g/dL (ref 6.0–8.3)

## 2020-06-14 LAB — LIPID PANEL
Cholesterol: 162 mg/dL (ref 0–200)
HDL: 52.3 mg/dL (ref >39.00)
LDL Cholesterol: 95 mg/dL (ref 0–99)
NonHDL: 109.51
Total CHOL/HDL Ratio: 3
Triglycerides: 75 mg/dL (ref 0.0–149.0)
VLDL: 15 mg/dL (ref 0.0–40.0)

## 2020-06-14 LAB — VITAMIN D 25 HYDROXY (VIT D DEFICIENCY, FRACTURES): VITD: 17.55 ng/mL — ABNORMAL LOW (ref 30.00–100.00)

## 2020-06-14 LAB — TSH: TSH: 0.86 u[IU]/mL (ref 0.35–4.50)

## 2020-06-14 LAB — HEMOGLOBIN A1C: Hgb A1c MFr Bld: 6.6 % — ABNORMAL HIGH (ref 4.6–6.5)

## 2020-06-21 ENCOUNTER — Encounter: Payer: BC Managed Care – PPO | Admitting: Family Medicine

## 2020-07-28 ENCOUNTER — Other Ambulatory Visit: Payer: Self-pay | Admitting: Family Medicine

## 2020-07-28 DIAGNOSIS — G43009 Migraine without aura, not intractable, without status migrainosus: Secondary | ICD-10-CM

## 2020-07-28 NOTE — Telephone Encounter (Signed)
Pharmacy requests refill on: Amitriptyline HCL 25 mg  LAST REFILL: 05/17/2020 LAST OV: 01/09/2020 NEXT OV: 08/06/2020 PHARMACY: [facility]

## 2020-08-06 ENCOUNTER — Ambulatory Visit (INDEPENDENT_AMBULATORY_CARE_PROVIDER_SITE_OTHER): Payer: BC Managed Care – PPO | Admitting: Family Medicine

## 2020-08-06 ENCOUNTER — Other Ambulatory Visit: Payer: Self-pay

## 2020-08-06 VITALS — BP 100/78 | HR 108 | Temp 98.0°F | Ht 61.75 in | Wt 157.0 lb

## 2020-08-06 DIAGNOSIS — E559 Vitamin D deficiency, unspecified: Secondary | ICD-10-CM | POA: Diagnosis not present

## 2020-08-06 DIAGNOSIS — G43009 Migraine without aura, not intractable, without status migrainosus: Secondary | ICD-10-CM

## 2020-08-06 DIAGNOSIS — E119 Type 2 diabetes mellitus without complications: Secondary | ICD-10-CM | POA: Diagnosis not present

## 2020-08-06 DIAGNOSIS — Z0000 Encounter for general adult medical examination without abnormal findings: Secondary | ICD-10-CM | POA: Diagnosis not present

## 2020-08-06 MED ORDER — ATORVASTATIN CALCIUM 20 MG PO TABS: 20.0000 mg | ORAL_TABLET | Freq: Every day | ORAL | 3 refills | Status: DC

## 2020-08-06 MED ORDER — VITAMIN D3 1.25 MG (50000 UT) PO TABS: 1.0000 | ORAL_TABLET | ORAL | 3 refills | Status: DC

## 2020-08-06 MED ORDER — METFORMIN HCL ER 500 MG PO TB24: 500.0000 mg | ORAL_TABLET | Freq: Every day | ORAL | 3 refills | Status: DC

## 2020-08-06 NOTE — Patient Instructions (Signed)
Good to see you today   You will get a call about diabetic nutrition therapy   There is not one right eating plan for everyone.  It may take trial and error to find what will work for you.  It is important to get adequate protein and fiber with your meals.  It is okay to not eat breakfast or to skip meals if you are not hungry.  Avoid snacking between meals.  Unless you are on a fluid restriction, drink 80 to 90 ounces of water a day.  Suggested resources- www.dietdoctor.com/diabetes/diet www.adaptyourlifeacademy.com-there is a quiz to help you determine how many carbohydrates you should eat a day  www.thefastingmethod.com  Here are some guidelines to help you with meal planning -  Avoid all processed and packaged foods (bread, pasta, crackers, chips, etc) and beverages containing calories.  Avoid added sugars and excessive natural sugars.  Pay attention to how you feel if you consume artificial sweeteners.  Do they make you more hungry or raise your blood sugar?  With every meal and snack, aim to get 20 g of protein (3 ounces of meat, 4 ounces of fish, 3 eggs, protein powder, 1 cup [place] yogurt, 1 cup cottage cheese, etc.)  Increase fiber in the form of non-starchy vegetables.  These help you feel full with very little carbohydrates and are good for gut health.  Nonstarchy vegetables include summer squash, onions, peppers, tomatoes, eggplant, broccoli, cauliflower, cabbage, lettuce, spinach.  Have small amounts of good fats such as avocado, nuts, olive oil, nut butters, olives.  Add a little cheese to your meals to make them tasty.   Try to plan your meals for the week and do some meal preparation when able.  If possible, make lunches for the week ahead of time.  Plan a couple of dinners and make enough so you can have leftovers.  Build in a treat once a week.

## 2020-08-06 NOTE — Progress Notes (Signed)
Subjective:    Patient ID: Glenda Weiss, female    DOB: 04/24/1981, 39 y.o.   MRN: [id number]  HPI Chief Complaint  Patient presents with  . Annual Exam    no concerns    This is a 39 yo female who presents today for annual exam. She  has a past medical history of Medical history non-contributory and Migraines.   Last CPE- 06/23/2019 Mammo- gyn, October Pap- gyn, October Tdap- 06/28/2016 Flu-annual, will have today Covid 19 vaccine- fully vaccinated Eye- has had this year Dental- regular Exercise- none  Diabetes- has history of prediabetes, most recent hemoglobin A1c 6.6, eats 3 meals a day, snacks occasionally. Drinks water and coffee. Eats carbs with every meal. Is interested in diabetic education, agreeable with starting medication.   Vitamin D deficiency- noted on recent labs  Headaches- better with amitriptyline  Review of Systems  Constitutional: Negative.   HENT: Negative.   Eyes: Negative.   Respiratory: Negative.   Cardiovascular: Negative.   Endocrine: Negative.   Genitourinary: Negative.   Musculoskeletal: Negative.   Skin: Negative.   Allergic/Immunologic: Negative.   Neurological: Positive for headaches.  Hematological: Negative.   Psychiatric/Behavioral: Positive for sleep disturbance (toddler awakens her several nights a week). Negative for dysphoric mood. The patient is not nervous/anxious.        Objective:   Physical Exam Physical Exam  Constitutional: She is oriented to person, place, and time. She appears well-developed and well-nourished. No distress.  HENT:  Head: Normocephalic and atraumatic.  Right Ear: External ear normal. TM normal.  Left Ear: External ear normal. TM normal.  Nose: Nose normal.  Mouth/Throat: Oropharynx is clear and moist. No oropharyngeal exudate.  Eyes: Conjunctivae are normal.   Neck: Normal range of motion. Neck supple. No JVD present. No thyromegaly present.  Cardiovascular: Normal rate, regular rhythm,  normal heart sounds and intact distal pulses.   Pulmonary/Chest: Effort normal and breath sounds normal.  Abdominal: Soft. Bowel sounds are normal. She exhibits no distension and no mass. There is no tenderness. There is no rebound and no guarding.  Musculoskeletal: Normal range of motion. She exhibits no edema or tenderness.  Lymphadenopathy:    She has no cervical adenopathy.  Neurological: She is alert and oriented to person, place, and time.   Skin: Skin is warm and dry. She is not diaphoretic.  Psychiatric: She has a normal mood and affect. Her behavior is normal. Judgment and thought content normal.  Vitals reviewed.     BP 100/78   Pulse (!) 108   Temp 98 F (36.7 C) (Temporal)   Ht 5' 1.75" (1.568 m)   Wt 157 lb (71.2 kg)   LMP 07/31/2020 (Exact Date)   SpO2 98%   Breastfeeding No   BMI 28.95 kg/m  Wt Readings from Last 3 Encounters:  08/06/20 157 lb (71.2 kg)  01/09/20 157 lb 1.6 oz (71.3 kg)  01/02/20 154 lb 1.6 oz (69.9 kg)   Depression screen [facility] 2/9 08/06/2020 01/09/2020 06/20/2019 07/14/2014  Decreased Interest 0 0 0 0  Down, Depressed, Hopeless 0 0 0 0  PHQ - 2 Score 0 0 0 0       Assessment & Plan:  1. Annual physical exam - Discussed and encouraged healthy lifestyle choices- adequate sleep, regular exercise, stress management and healthy food choices.  -Will get Pap and mammogram from GYN  2. New onset type 2 diabetes mellitus (HCC) -Provided written and verbal information regarding diagnosis and dietary recommendations -Discussed possible  side effects of medication -Follow-up in 3 to 4 months, sooner if problems or questions -She is not interested in home glucose testing at this time - metFORMIN (GLUCOPHAGE XR) 500 MG 24 hr tablet; Take 1 tablet (500 mg total) by mouth daily with breakfast.  Dispense: 90 tablet; Refill: 3 - atorvastatin (LIPITOR) 20 MG tablet; Take 1 tablet (20 mg total) by mouth daily.  Dispense: 90 tablet; Refill: 3 - Amb ref to  Medical Nutrition Therapy-MNT  3. Vitamin D deficiency - Cholecalciferol (VITAMIN D3) 1.25 MG (50000 UT) TABS; Take 1 tablet by mouth every 7 (seven) days.  Dispense: 12 tablet; Refill: 3 -Discussed importance of dietary calcium and reasonable sun exposure  4. Migraine without aura and without status migrainosus, not intractable -Doing well on amitriptyline  This visit occurred during the SARS-CoV-2 public health emergency.  Safety protocols were in place, including screening questions prior to the visit, additional usage of staff PPE, and extensive cleaning of exam room while observing appropriate contact time as indicated for disinfecting solutions.      Olean Ree, FNP-BC  [place] [facility] at [facility], [facility]  08/07/2020 8:37 AM

## 2020-08-07 ENCOUNTER — Encounter: Payer: Self-pay | Admitting: Family Medicine

## 2020-08-07 DIAGNOSIS — E559 Vitamin D deficiency, unspecified: Secondary | ICD-10-CM | POA: Insufficient documentation

## 2020-08-07 DIAGNOSIS — G43009 Migraine without aura, not intractable, without status migrainosus: Secondary | ICD-10-CM | POA: Insufficient documentation

## 2020-08-07 DIAGNOSIS — E119 Type 2 diabetes mellitus without complications: Secondary | ICD-10-CM | POA: Insufficient documentation

## 2020-08-30 ENCOUNTER — Other Ambulatory Visit: Payer: Self-pay

## 2020-08-30 ENCOUNTER — Encounter: Payer: BC Managed Care – PPO | Attending: Primary Care | Admitting: Dietician

## 2020-08-30 ENCOUNTER — Encounter: Payer: Self-pay | Admitting: Dietician

## 2020-08-30 VITALS — Ht 61.0 in | Wt 155.9 lb

## 2020-08-30 DIAGNOSIS — E119 Type 2 diabetes mellitus without complications: Secondary | ICD-10-CM | POA: Diagnosis not present

## 2020-08-30 NOTE — Patient Instructions (Signed)
·   Great job making healthy lifestyle changes!  Continue with regular exercise.   Keep carbs to 30-45grams with each meal (fist size portion of rice or pasta). Choose mostly whole grain or high fiber versions of starchy foods.

## 2020-08-30 NOTE — Progress Notes (Signed)
Medical Nutrition Therapy: Visit start time: 1040  end time: 1125  Assessment:  Diagnosis: Type 2 Diabetes Past medical history: hyperlipidemia Psychosocial issues/ stress concerns: none  Preferred learning method:  . Auditory . Visual . Hands-on  Current weight: 155.9lbs Height: 5'1" Medications, supplements: reconciled list in medical record  Progress and evaluation:   Recent diagnosis of Type 2 diabetes; HbA1C of 6.6%, increased from 6.4% 1 year ago. Patient is not checking BGs at this time.   Patient reports she has reduced carb intake and increased exercise.   She was eating small portions of chocolate candy occasionally, but has now stopped.  She has not yet begun taking Metformin; wants to work on diet and exercise first.    Physical activity: elliptical + strength training 30-45 minutes 4-5x a week  Dietary Intake:  Usual eating pattern includes 2 meals and 2 snacks per day. Dining out frequency: 3-4 meals per week.  Breakfast: coffee, mixed nuts with seeds Snack: 10:30-11 Vilma Meckel delights/ cereal/ yogurt with high protein granola Lunch: protein + veg Snack: rice cake/ apple and peanut butter/ nuts Supper: proteins = turkey/ chicken/ baked salmon/ not much red meat + sides = roast brussels sprouts/ zucchini/ carrots + brown rice or chickpea pasta ex -- salad with grilled chicken/ [place] meatballs with chickpea noodles  Snack: none Beverages: 4-5 glasses water, coffee in am  Nutrition Care Education: Topics covered:  Basic nutrition: basic food groups, appropriate nutrient balance, appropriate meal and snack schedule, general nutrition guidelines    Advanced nutrition: food label reading for carbohydrate content Diabetes: appropriate meal and snack schedule, appropriate carb intake and balance, healthy carb choices, role of fiber, protein, fat; role of exercise Hyperlipidemia:  healthy and unhealthy fats   Nutritional Diagnosis:  [place]-2.2 Altered  nutrition-related laboratory As related to Type 2 Diabetes.  As evidenced by patient with recent HbA1C of 6.6%.  Intervention:  . Instruction and discussion as noted above. . Patient has been making significant lifestyle changes to improve BG control and avoid need for medication. . Goals are to continue with her current eating pattern and exercise, no additional change advised at this time.  . No follow-up scheduled at this time; patient to schedule later if needed.  Education Materials given:  . General diet guidelines for Diabetes . Plate Planner with food lists . Sample menus . Snacking handout . Visit summary with goals/ instructions   Learner/ who was taught:  . Patient   Level of understanding: Marland Kitchen Verbalizes/ demonstrates competency   Demonstrated degree of understanding via:   Teach back Learning barriers: . None  Willingness to learn/ readiness for change: . Eager, change in progress   Monitoring and Evaluation:  Dietary intake, exercise, BG control, and body weight      follow up: prn

## 2020-09-09 ENCOUNTER — Encounter: Payer: Self-pay | Admitting: Family Medicine

## 2020-11-04 ENCOUNTER — Other Ambulatory Visit: Payer: Self-pay

## 2020-11-04 ENCOUNTER — Ambulatory Visit (INDEPENDENT_AMBULATORY_CARE_PROVIDER_SITE_OTHER): Payer: BC Managed Care – PPO | Admitting: Family Medicine

## 2020-11-04 VITALS — BP 100/70 | HR 83 | Temp 98.1°F | Ht 61.0 in | Wt 147.0 lb

## 2020-11-04 DIAGNOSIS — E119 Type 2 diabetes mellitus without complications: Secondary | ICD-10-CM | POA: Diagnosis not present

## 2020-11-04 LAB — POCT GLYCOSYLATED HEMOGLOBIN (HGB A1C): Hemoglobin A1C: 5.4 % (ref 4.0–5.6)

## 2020-11-04 NOTE — Assessment & Plan Note (Signed)
Diet controlled and in remission with 10 lb weight loss. Encouraged continued diabetic diet, exercise, and weight loss as able. Return 6 months.

## 2020-11-04 NOTE — Progress Notes (Signed)
Subjective:     Glenda Weiss is a 40 y.o. female presenting for Follow-up (3 month- dm)     HPI  #Diabetes Currently taking - nothing  Does not check sugars at home Not taking medication  Last HgbA1c:  Lab Results  Component Value Date   HGBA1C 5.4 11/04/2020    Diabetes Health Maintenance Due:    Diabetes Health Maintenance Due  Topic Date Due  . OPHTHALMOLOGY EXAM  Never done  . URINE MICROALBUMIN  Never done  . HEMOGLOBIN A1C  05/07/2021  . FOOT EXAM  11/04/2021   Just doing diet and exercise  Has lost 10 lbs    Review of Systems  08/06/2020: Clinic - DM - stat metformin and nutrition referral  Social History   Tobacco Use  Smoking Status Current Some Day Smoker  . Types: Cigarettes  Smokeless Tobacco Never Used  Tobacco Comment   cig smoking is social - with alcohol        Objective:    BP Readings from Last 3 Encounters:  11/04/20 100/70  08/06/20 100/78  01/09/20 116/68   Wt Readings from Last 3 Encounters:  11/04/20 147 lb (66.7 kg)  08/30/20 155 lb 14.4 oz (70.7 kg)  08/06/20 157 lb (71.2 kg)    BP 100/70   Pulse 83   Temp 98.1 F (36.7 C) (Temporal)   Ht 5\' 1"  (1.549 m)   Wt 147 lb (66.7 kg)   LMP 10/27/2020 (Exact Date)   SpO2 98%   BMI 27.78 kg/m    Physical Exam Constitutional:      General: She is not in acute distress.    Appearance: She is well-developed. She is not diaphoretic.  HENT:     Right Ear: External ear normal.     Left Ear: External ear normal.     Nose: Nose normal.  Eyes:     Conjunctiva/sclera: Conjunctivae normal.  Cardiovascular:     Rate and Rhythm: Normal rate.  Pulmonary:     Effort: Pulmonary effort is normal.  Musculoskeletal:     Cervical back: Neck supple.  Skin:    General: Skin is warm and dry.     Capillary Refill: Capillary refill takes less than 2 seconds.  Neurological:     Mental Status: She is alert. Mental status is at baseline.  Psychiatric:        Mood and Affect: Mood  normal.        Behavior: Behavior normal.    Diabetic Foot Exam - Simple   Simple Foot Form Diabetic Foot exam was performed with the following findings: Yes 11/04/2020  3:37 PM  Visual Inspection No deformities, no ulcerations, no other skin breakdown bilaterally: Yes Sensation Testing Intact to touch and monofilament testing bilaterally: Yes Pulse Check Posterior Tibialis and Dorsalis pulse intact bilaterally: Yes Comments           Assessment & Plan:   Problem List Items Addressed This Visit      Endocrine   New onset type 2 diabetes mellitus (HCC) - Primary    Diet controlled and in remission with 10 lb weight loss. Encouraged continued diabetic diet, exercise, and weight loss as able. Return 6 months.       Relevant Orders   POCT glycosylated hemoglobin (Hb A1C) (Completed)   Microalbumin / creatinine urine ratio       Return in about 6 months (around 05/07/2021).  07/07/2021, MD  This visit occurred during the SARS-CoV-2 public health  emergency.  Safety protocols were in place, including screening questions prior to the visit, additional usage of staff PPE, and extensive cleaning of exam room while observing appropriate contact time as indicated for disinfecting solutions.

## 2020-11-04 NOTE — Patient Instructions (Signed)
#   get a diabetic eye exam - make sure to let them know you have diabetes

## 2020-11-05 LAB — MICROALBUMIN / CREATININE URINE RATIO
Creatinine,U: 43.7 mg/dL
Microalb Creat Ratio: 1.6 mg/g (ref 0.0–30.0)
Microalb, Ur: <0.7 mg/dL (ref 0.0–1.9)

## 2021-05-12 ENCOUNTER — Ambulatory Visit: Payer: BC Managed Care – PPO | Admitting: Family Medicine

## 2021-05-17 ENCOUNTER — Other Ambulatory Visit: Payer: Self-pay

## 2021-05-17 ENCOUNTER — Ambulatory Visit (INDEPENDENT_AMBULATORY_CARE_PROVIDER_SITE_OTHER): Payer: BC Managed Care – PPO | Admitting: Family Medicine

## 2021-05-17 VITALS — BP 102/80 | HR 75 | Temp 97.0°F | Wt 135.5 lb

## 2021-05-17 DIAGNOSIS — E119 Type 2 diabetes mellitus without complications: Secondary | ICD-10-CM | POA: Diagnosis not present

## 2021-05-17 DIAGNOSIS — R519 Headache, unspecified: Secondary | ICD-10-CM | POA: Insufficient documentation

## 2021-05-17 DIAGNOSIS — N644 Mastodynia: Secondary | ICD-10-CM | POA: Insufficient documentation

## 2021-05-17 LAB — LIPID PANEL
Cholesterol: 180 mg/dL (ref 0–200)
HDL: 56.1 mg/dL (ref >39.00)
LDL Cholesterol: 104 mg/dL — ABNORMAL HIGH (ref 0–99)
NonHDL: 124.28
Total CHOL/HDL Ratio: 3
Triglycerides: 102 mg/dL (ref 0.0–149.0)
VLDL: 20.4 mg/dL (ref 0.0–40.0)

## 2021-05-17 LAB — COMPREHENSIVE METABOLIC PANEL
ALT: 11 U/L (ref 0–35)
AST: 14 U/L (ref 0–37)
Albumin: 3.8 g/dL (ref 3.5–5.2)
Alkaline Phosphatase: 33 U/L — ABNORMAL LOW (ref 39–117)
BUN: 17 mg/dL (ref 6–23)
CO2: 28 mEq/L (ref 19–32)
Calcium: 9.1 mg/dL (ref 8.4–10.5)
Chloride: 102 mEq/L (ref 96–112)
Creatinine, Ser: 0.79 mg/dL (ref 0.40–1.20)
GFR: 92.69 mL/min (ref >60.00)
Glucose, Bld: 80 mg/dL (ref 70–99)
Potassium: 3.8 mEq/L (ref 3.5–5.1)
Sodium: 137 mEq/L (ref 135–145)
Total Bilirubin: 0.4 mg/dL (ref 0.2–1.2)
Total Protein: 7.2 g/dL (ref 6.0–8.3)

## 2021-05-17 LAB — POCT GLYCOSYLATED HEMOGLOBIN (HGB A1C): Hemoglobin A1C: 5.5 % (ref 4.0–5.6)

## 2021-05-17 NOTE — Assessment & Plan Note (Signed)
1 week of Headaches x 2. She will try tylenol prn and monitor. If persisting may consider neurology, change in amitriptyline or head imaging.

## 2021-05-17 NOTE — Assessment & Plan Note (Signed)
She will take home pregnancy test and watch and wait. Warning signs discussed. As bilateral overall reassuring. F/u in a few weeks or with GYN if no improvement.

## 2021-05-17 NOTE — Assessment & Plan Note (Signed)
Diet controlled. Doing well. Return 1 year. Get eye exam. Declined pneumonia - seems reasonable given excellent control and young age.

## 2021-05-17 NOTE — Patient Instructions (Addendum)
Great job on Diabetes!  Continue healthy diet and exercise  #Headache  - Tylenol 1000 mg + Ibuprofen 600-800 mg  - Avoid if you can taking medication every day (or alternate type) - get eye exam as planned   #Nipple  - Return soon - if discharge, color change, lumps - Update if no improvement - after 1 month - could consider earlier imaging

## 2021-05-17 NOTE — Progress Notes (Signed)
Subjective:     Glenda Weiss is a 40 y.o. female presenting for Follow-up (6 mo)     HPI  #Diabetes Currently taking diet exam  Using medications without difficulties: No Hypoglycemic episodes:No  Hyperglycemic episodes:No  Feet problems:No  Blood Sugars averaging: does not check Last HgbA1c:  Lab Results  Component Value Date   HGBA1C 5.5 05/17/2021    Diabetes Health Maintenance Due:    Diabetes Health Maintenance Due  Topic Date Due   OPHTHALMOLOGY EXAM  Never done   FOOT EXAM  11/04/2021   URINE MICROALBUMIN  11/04/2021   HEMOGLOBIN A1C  11/14/2021    Diet: doing well  Exercise:   Weight loss - has a goal 130  #Headaches - 1 week in July with more HA - was waking up daily with a HA and going to sleep with a HA - August also had a week of significant - is taking amitriptyline for vertigo and migraines - but this HA has been different - frontal and bilateral - typically ibuprofen would work 2-4 pills  - does not take tyenol - diet - no changes - had changed caffeine  - is on OCPs - and this occurred before the period - during the pill cycle - no changes in OCPs - hydration - doing more cardio activity - but water intake did not change during that week  - does not drink alcohol  - sleep unchanged - no sinus symptoms or allergies  - no nausea or vomiting   #nipple pain - no drainage - discharge - bilateral   Review of Systems 11/04/2020: Clinic - DM - 10 lb weight loss and diet controlled  Social History   Tobacco Use  Smoking Status Some Days   Types: Cigarettes  Smokeless Tobacco Never  Tobacco Comments   cig smoking is social - with alcohol        Objective:    BP Readings from Last 3 Encounters:  05/17/21 102/80  11/04/20 100/70  08/06/20 100/78   Wt Readings from Last 3 Encounters:  05/17/21 135 lb 8 oz (61.5 kg)  11/04/20 147 lb (66.7 kg)  08/30/20 155 lb 14.4 oz (70.7 kg)    BP 102/80   Pulse 75   Temp (!) 97 F  (36.1 C) (Temporal)   Wt 135 lb 8 oz (61.5 kg)   SpO2 97%   BMI 25.60 kg/m    Physical Exam Constitutional:      General: She is not in acute distress.    Appearance: She is well-developed. She is not diaphoretic.  HENT:     Right Ear: External ear normal.     Left Ear: External ear normal.  Eyes:     Conjunctiva/sclera: Conjunctivae normal.  Cardiovascular:     Rate and Rhythm: Normal rate and regular rhythm.  Pulmonary:     Effort: Pulmonary effort is normal.     Breath sounds: Normal breath sounds.  Musculoskeletal:     Cervical back: Neck supple.  Skin:    General: Skin is warm and dry.     Capillary Refill: Capillary refill takes less than 2 seconds.  Neurological:     Mental Status: She is alert. Mental status is at baseline.  Psychiatric:        Mood and Affect: Mood normal.        Behavior: Behavior normal.          Assessment & Plan:   Problem List Items Addressed This Visit  Endocrine   Type 2 diabetes mellitus without complication, without long-term current use of insulin (HCC) - Primary    Diet controlled. Doing well. Return 1 year. Get eye exam. Declined pneumonia - seems reasonable given excellent control and young age.       Relevant Orders   Comprehensive metabolic panel   Lipid panel     Other   Nonintractable episodic headache    1 week of Headaches x 2. She will try tylenol prn and monitor. If persisting may consider neurology, change in amitriptyline or head imaging.       Nipple pain    She will take home pregnancy test and watch and wait. Warning signs discussed. As bilateral overall reassuring. F/u in a few weeks or with GYN if no improvement.         Return in about 1 year (around 05/17/2022).  Lynnda Child, MD  This visit occurred during the SARS-CoV-2 public health emergency.  Safety protocols were in place, including screening questions prior to the visit, additional usage of staff PPE, and extensive cleaning of exam  room while observing appropriate contact time as indicated for disinfecting solutions.

## 2021-06-28 LAB — HM DIABETES EYE EXAM

## 2021-07-04 ENCOUNTER — Other Ambulatory Visit: Payer: Self-pay | Admitting: Family Medicine

## 2021-07-06 ENCOUNTER — Encounter: Payer: Self-pay | Admitting: Family Medicine

## 2021-07-06 MED ORDER — KURVELO 0.15-30 MG-MCG PO TABS: 1.0000 | ORAL_TABLET | Freq: Every day | ORAL | 3 refills | Status: DC

## 2021-07-15 ENCOUNTER — Encounter: Payer: Self-pay | Admitting: Family Medicine

## 2021-07-27 ENCOUNTER — Other Ambulatory Visit: Payer: Self-pay

## 2021-07-27 DIAGNOSIS — G43009 Migraine without aura, not intractable, without status migrainosus: Secondary | ICD-10-CM

## 2021-07-27 MED ORDER — AMITRIPTYLINE HCL 25 MG PO TABS: 25.0000 mg | ORAL_TABLET | Freq: Every day | ORAL | 3 refills | Status: DC

## 2021-09-28 ENCOUNTER — Telehealth: Payer: Self-pay | Admitting: Family Medicine

## 2021-09-28 ENCOUNTER — Ambulatory Visit: Payer: BC Managed Care – PPO | Admitting: Family

## 2021-09-28 DIAGNOSIS — G43009 Migraine without aura, not intractable, without status migrainosus: Secondary | ICD-10-CM

## 2021-09-28 MED ORDER — AMITRIPTYLINE HCL 25 MG PO TABS: 25.0000 mg | ORAL_TABLET | Freq: Every day | ORAL | 2 refills | Status: DC

## 2021-09-28 NOTE — Telephone Encounter (Signed)
Rx sent in and pt notified 

## 2021-09-28 NOTE — Telephone Encounter (Addendum)
Pt need refill on amitriptyline (ELAVIL) 25 MG tablet sent to [facility] [address] [place]. Pt would like to be contacted when prescription is sent in

## 2021-09-28 NOTE — Telephone Encounter (Signed)
ERROR

## 2021-11-09 LAB — HM MAMMOGRAPHY

## 2021-11-09 LAB — RESULTS CONSOLE HPV: CHL HPV: NEGATIVE

## 2021-11-09 LAB — HM PAP SMEAR: HM Pap smear: NEGATIVE

## 2022-02-22 ENCOUNTER — Encounter: Payer: Self-pay | Admitting: Family Medicine

## 2022-03-27 ENCOUNTER — Other Ambulatory Visit: Payer: Self-pay

## 2022-05-18 ENCOUNTER — Ambulatory Visit (INDEPENDENT_AMBULATORY_CARE_PROVIDER_SITE_OTHER): Payer: 59 | Admitting: Family Medicine

## 2022-05-18 ENCOUNTER — Encounter: Payer: Self-pay | Admitting: Family Medicine

## 2022-05-18 VITALS — BP 100/60 | HR 85 | Temp 97.6°F | Ht 62.0 in | Wt 145.2 lb

## 2022-05-18 DIAGNOSIS — E119 Type 2 diabetes mellitus without complications: Secondary | ICD-10-CM | POA: Diagnosis not present

## 2022-05-18 DIAGNOSIS — E785 Hyperlipidemia, unspecified: Secondary | ICD-10-CM | POA: Diagnosis not present

## 2022-05-18 DIAGNOSIS — G43009 Migraine without aura, not intractable, without status migrainosus: Secondary | ICD-10-CM

## 2022-05-18 DIAGNOSIS — Z23 Encounter for immunization: Secondary | ICD-10-CM | POA: Diagnosis not present

## 2022-05-18 DIAGNOSIS — E1169 Type 2 diabetes mellitus with other specified complication: Secondary | ICD-10-CM | POA: Diagnosis not present

## 2022-05-18 DIAGNOSIS — Z0000 Encounter for general adult medical examination without abnormal findings: Secondary | ICD-10-CM

## 2022-05-18 LAB — HEMOGLOBIN A1C: Hgb A1c MFr Bld: 6.1 % (ref 4.6–6.5)

## 2022-05-18 LAB — MICROALBUMIN / CREATININE URINE RATIO
Creatinine,U: 81.5 mg/dL
Microalb Creat Ratio: 0.9 mg/g (ref 0.0–30.0)
Microalb, Ur: <0.7 mg/dL (ref 0.0–1.9)

## 2022-05-18 LAB — COMPREHENSIVE METABOLIC PANEL
ALT: 11 U/L (ref 0–35)
AST: 14 U/L (ref 0–37)
Albumin: 3.9 g/dL (ref 3.5–5.2)
Alkaline Phosphatase: 31 U/L — ABNORMAL LOW (ref 39–117)
BUN: 12 mg/dL (ref 6–23)
CO2: 29 mEq/L (ref 19–32)
Calcium: 9.3 mg/dL (ref 8.4–10.5)
Chloride: 103 mEq/L (ref 96–112)
Creatinine, Ser: 0.8 mg/dL (ref 0.40–1.20)
GFR: 90.66 mL/min (ref >60.00)
Glucose, Bld: 81 mg/dL (ref 70–99)
Potassium: 4.1 mEq/L (ref 3.5–5.1)
Sodium: 138 mEq/L (ref 135–145)
Total Bilirubin: 0.4 mg/dL (ref 0.2–1.2)
Total Protein: 7.5 g/dL (ref 6.0–8.3)

## 2022-05-18 LAB — LIPID PANEL
Cholesterol: 166 mg/dL (ref 0–200)
HDL: 63.1 mg/dL (ref >39.00)
LDL Cholesterol: 86 mg/dL (ref 0–99)
NonHDL: 102.99
Total CHOL/HDL Ratio: 3
Triglycerides: 87 mg/dL (ref 0.0–149.0)
VLDL: 17.4 mg/dL (ref 0.0–40.0)

## 2022-05-18 MED ORDER — KURVELO 0.15-30 MG-MCG PO TABS: 1.0000 | ORAL_TABLET | Freq: Every day | ORAL | 1 refills | Status: DC

## 2022-05-18 MED ORDER — AMITRIPTYLINE HCL 25 MG PO TABS: 25.0000 mg | ORAL_TABLET | Freq: Every day | ORAL | 1 refills | Status: DC

## 2022-05-18 NOTE — Assessment & Plan Note (Signed)
Stable on amitriptyline 25 mg.

## 2022-05-18 NOTE — Assessment & Plan Note (Signed)
Lab Results  Component Value Date   LDLCALC 104 (H) 05/17/2021   Discussed goal of <70 for DM. Consider Statin. Will reassess today

## 2022-05-18 NOTE — Assessment & Plan Note (Signed)
Recheck today. Diet controlled.

## 2022-05-18 NOTE — Patient Instructions (Addendum)
Eligible due to diabetes for PPSV-23 (pneumonia shot)  Your cholesterol is high.  You can work to lower cholesterol through diet and exercise.  I would recommend considering the following:   1) Diet -Mediterranean diet, vegetarian or meat restricted diet, low carbohydrate diet, avoiding trans fats, or the Dash diet.   2) You can consider trying omega-3 fatty acids or red yeast rice which may have some benefit.  But dietary changes are better than supplements.  3) Exercise - I recommended 30 minutes of brisk walking 5-7 days a week

## 2022-05-18 NOTE — Progress Notes (Signed)
Annual Exam   Chief Complaint:  Chief Complaint  Patient presents with   Annual Exam    Req pap/mammo records from [facility]    History of Present Illness:  [name] is a 41 y.o. [id number] who LMP was Patient's last menstrual period was 05/04/2022 (exact date)., presents today for her annual examination.     Nutrition Diet: healthy, diabetic diet Exercise: walking and ellipitical She does get adequate calcium and Vitamin D in her diet.   Social History   Tobacco Use  Smoking Status Former   Types: Cigarettes   Quit date: 09/05/2019   Years since quitting: 2.7  Smokeless Tobacco Never  Tobacco Comments   Hx of light smoking   Social History   Substance and Sexual Activity  Alcohol Use Yes   Comment: occasional - rare   Social History   Substance and Sexual Activity  Drug Use No    Safety The patient wears seatbelts: yes.     The patient feels safe at home and in their relationships: yes.  General Health Dentist in the last year: Yes Eye doctor: yes  Menstrual Her menses are normal, no concerns  GYN She is single partner, contraception - OCP (estrogen/progesterone).    Cervical Cancer Screening:   Last Pap:   08/2020 with ob   Breast Cancer Screening There is no FH of breast cancer. There is no FH of ovarian cancer. BRCA screening Not Indicated.  Discussed that for average risk women between age 65-49 screening may reduce the risk of breast cancer death, however, at a lower rate than those over age 3. And that the the false-positive rates resulting in unnecessary biopsies with more screening is higher. The balance of benefits vs harms likely improves as you progress through your 40s. The patient does want a mammogram this year.   Weight Wt Readings from Last 3 Encounters:  05/18/22 145 lb 4 oz (65.9 kg)  05/17/21 135 lb 8 oz (61.5 kg)  11/04/20 147 lb (66.7 kg)   Patient has normal BMI  BMI Readings from Last 1 Encounters:  05/18/22  26.57 kg/m     Chronic disease screening Blood pressure monitoring:  BP Readings from Last 3 Encounters:  05/18/22 100/60  05/17/21 102/80  11/04/20 100/70    Lipid Monitoring: Indication for screening: age >62, obesity, diabetes, family hx, CV risk factors.  Lipid screening: Yes  Lab Results  Component Value Date   CHOL 180 05/17/2021   HDL 56.10 05/17/2021   LDLCALC 104 (H) 05/17/2021   TRIG 102.0 05/17/2021   CHOLHDL 3 05/17/2021     Diabetes Screening: age >61, overweight, family hx, PCOS, hx of gestational diabetes, at risk ethnicity Diabetes Screening screening: Yes  Lab Results  Component Value Date   HGBA1C 5.5 05/17/2021     Past Medical History:  Diagnosis Date   Medical history non-contributory    Migraines     Past Surgical History:  Procedure Laterality Date   DILATION AND EVACUATION N/A 08/05/2014   Procedure: DILATATION AND EVACUATION (D&E) 2ND TRIMESTER;  Surgeon: Olga Millers, MD;  Location: [facility];  Service: Gynecology;  Laterality: N/A;   NO PAST SURGERIES      Prior to Admission medications   Medication Sig Start Date End Date Taking? Authorizing Provider  amitriptyline (ELAVIL) 25 MG tablet Take 1 tablet (25 mg total) by mouth at bedtime. 09/28/21  Yes Lesleigh Noe, MD  Cholecalciferol (VITAMIN D3) 1.25 MG (50000 UT) CAPS Take 1  capsule by mouth once a week. 03/28/21  Yes [provider]  ibuprofen (ADVIL) 200 MG tablet 2 (two) times daily as needed.   Yes [provider]  KURVELO 0.15-30 MG-MCG tablet Take 1 tablet by mouth daily. 07/06/21  Yes Lesleigh Noe, MD    No Known Allergies  Gynecologic History: Patient's last menstrual period was 05/04/2022 (exact date).  Obstetric History: [id number]  Social History   Socioeconomic History   Marital status: Divorced    Spouse name: Not on file   Number of children: Not on file   Years of education: Not on file   Highest education level: Not on file  Occupational  History   Not on file  Tobacco Use   Smoking status: Former    Types: Cigarettes    Quit date: 09/05/2019    Years since quitting: 2.7   Smokeless tobacco: Never   Tobacco comments:    Hx of light smoking  Vaping Use   Vaping Use: Never used  Substance and Sexual Activity   Alcohol use: Yes    Comment: occasional - rare   Drug use: No   Sexual activity: Yes    Birth control/protection: None  Other Topics Concern   Not on file  Social History Narrative   Not on file   Social Determinants of Health   Financial Resource Strain: Not on file  Food Insecurity: Not on file  Transportation Needs: Not on file  Physical Activity: Not on file  Stress: Not on file  Social Connections: Not on file  Intimate Partner Violence: Not on file    Family History  Problem Relation Age of Onset   Healthy Mother    Healthy Father    Drug abuse Neg Hx     Review of Systems  Constitutional:  Negative for chills and fever.  HENT:  Negative for congestion and sore throat.   Eyes:  Negative for blurred vision and double vision.  Respiratory:  Negative for shortness of breath.   Cardiovascular:  Negative for chest pain.  Gastrointestinal:  Negative for heartburn, nausea and vomiting.  Genitourinary: Negative.   Musculoskeletal: Negative.  Negative for myalgias.  Skin:  Negative for rash.  Neurological:  Negative for dizziness and headaches.  Endo/Heme/Allergies:  Does not bruise/bleed easily.  Psychiatric/Behavioral:  Negative for depression. The patient is not nervous/anxious.      Physical Exam BP 100/60   Pulse 85   Temp 97.6 F (36.4 C) (Temporal)   Ht 5' 2" (1.575 m)   Wt 145 lb 4 oz (65.9 kg)   LMP 05/04/2022 (Exact Date)   SpO2 97%   BMI 26.57 kg/m    BP Readings from Last 3 Encounters:  05/18/22 100/60  05/17/21 102/80  11/04/20 100/70      Physical Exam Constitutional:      General: She is not in acute distress.    Appearance: She is well-developed. She is not  diaphoretic.  HENT:     Head: Normocephalic and atraumatic.     Right Ear: External ear normal.     Left Ear: External ear normal.     Nose: Nose normal.  Eyes:     General: No scleral icterus.    Extraocular Movements: Extraocular movements intact.     Conjunctiva/sclera: Conjunctivae normal.  Cardiovascular:     Rate and Rhythm: Normal rate and regular rhythm.     Heart sounds: No murmur heard. Pulmonary:     Effort: Pulmonary effort is normal. No  respiratory distress.     Breath sounds: Normal breath sounds. No wheezing.  Abdominal:     General: Bowel sounds are normal. There is no distension.     Palpations: Abdomen is soft. There is no mass.     Tenderness: There is no abdominal tenderness. There is no guarding or rebound.  Musculoskeletal:        General: Normal range of motion.     Cervical back: Neck supple.  Lymphadenopathy:     Cervical: No cervical adenopathy.  Skin:    General: Skin is warm and dry.     Capillary Refill: Capillary refill takes less than 2 seconds.  Neurological:     Mental Status: She is alert and oriented to person, place, and time.     Deep Tendon Reflexes: Reflexes normal.  Psychiatric:        Mood and Affect: Mood normal.        Behavior: Behavior normal.      Results:  PHQ-9:     Assessment: 41 y.o. O0H2122 female here for routine annual physical examination.  Plan: Problem List Items Addressed This Visit       Cardiovascular and Mediastinum   Migraine without aura and without status migrainosus, not intractable    Stable on amitriptyline 25 mg.       Relevant Medications   amitriptyline (ELAVIL) 25 MG tablet     Endocrine   Type 2 diabetes mellitus without complication, without long-term current use of insulin ([place])    Recheck today. Diet controlled.       Relevant Orders   Microalbumin / creatinine urine ratio   Hemoglobin A1c   Hyperlipidemia associated with type 2 diabetes mellitus ([place])    Lab Results   Component Value Date   LDLCALC 104 (H) 05/17/2021  Discussed goal of <70 for DM. Consider Statin. Will reassess today      Relevant Orders   Comprehensive metabolic panel   Lipid panel   Other Visit Diagnoses     Annual physical exam    -  Primary   Need for influenza vaccination       Relevant Orders   Flu Vaccine QUAD 75moIM (Fluarix, Fluzone & Alfiuria Quad PF) (Completed)       Screening: -- Blood pressure screen normal -- cholesterol screening: will obtain -- Weight screening: normal -- Diabetes Screening: will obtain -- Nutrition: Encouraged healthy diet  The 10-year ASCVD risk score (Arnett DK, et al., 2019) is: 2.4%   Values used to calculate the score:     Age: [age]years     Sex: Female     Is Non-Hispanic African American: No     Diabetic: Yes     Tobacco smoker: Yes     Systolic Blood Pressure: 1482mmHg     Is BP treated: No     HDL Cholesterol: 56.1 mg/dL     Total Cholesterol: 180 mg/dL  -- Statin therapy for Age 43-75with CVD risk >7.5%  Psych -- Depression screening (PHQ-9):     05/18/2022    9:01 AM 08/30/2020   10:43 AM 08/06/2020   12:28 PM  Depression screen PHQ 2/9  Decreased Interest 0 0 0  Down, Depressed, Hopeless 0 0 0  PHQ - 2 Score 0 0 0      Safety -- tobacco screening: not using -- alcohol screening:  low-risk usage. -- no evidence of domestic violence or intimate partner violence.   Cancer Screening -- pap smear not  collected per ASCCP guidelines -- family history of breast cancer screening: done. not at high risk. -- Mammogram - requested -- Colon cancer (age 19+)-- not indicated  Immunizations Immunization History  Administered Date(s) Administered   Influenza Inj Mdck Quad Pf 07/02/2014, 05/23/2016   Influenza,inj,Quad PF,6+ Mos 06/20/2019, 07/15/2020, 05/18/2022   Influenza-Unspecified 07/02/2014, 05/23/2016, 06/04/2018   PFIZER(Purple Top)SARS-COV-2 Vaccination 01/23/2020, 02/13/2020, 07/15/2020   Tdap  06/28/2016    -- flu vaccine up to date -- TDAP q10 years up to date -- PPSV-23 (19-64 with chronic disease or smoking) declined -- Covid-19 Vaccine up to date   Encouraged healthy diet and exercise. Encouraged regular vision and dental care.   Lesleigh Noe, MD

## 2022-07-25 ENCOUNTER — Ambulatory Visit: Payer: 59 | Admitting: Family Medicine

## 2022-07-25 ENCOUNTER — Encounter: Payer: Self-pay | Admitting: Family Medicine

## 2022-07-25 VITALS — BP 124/80 | HR 77 | Temp 98.0°F | Ht 62.0 in | Wt 144.0 lb

## 2022-07-25 DIAGNOSIS — M7989 Other specified soft tissue disorders: Secondary | ICD-10-CM | POA: Diagnosis not present

## 2022-07-25 NOTE — Progress Notes (Unsigned)
Lesion in the skin, unclear duration.  Noted recently.  On abd wall on the L side, not on the breast. Not painful.   No bruising or ulceration.  No skin changes superficially.  Locally itchy recently.  H/o dry skin at baseline. No FCNAVD.    Meds, vitals, and allergies reviewed.   ROS: Per HPI unless specifically indicated in ROS section   Nad Ncat Neck supple, no LA Rrr Ctab Abdomen soft.  Not tender. Chest wall exam done with chaperone.  She has a soft mass noted in the skin superficial to the left anterior lower ribs without overlying bruise ulceration or rash.

## 2022-07-25 NOTE — Patient Instructions (Signed)
Let us know if you don't get a call next week about the ultrasound.  Likely a lipoma.  Take care.  Glad to see you.

## 2022-07-26 DIAGNOSIS — M7989 Other specified soft tissue disorders: Secondary | ICD-10-CM | POA: Insufficient documentation

## 2022-07-26 NOTE — Assessment & Plan Note (Addendum)
Likely a lipoma.  Reasonable to get ultrasound to better characterize the lesion.  Discussed with patient.  Okay for outpatient follow-up.  It does not involve the breast.  No reason to suspect an ominous issue.  Discussed.

## 2022-08-07 ENCOUNTER — Ambulatory Visit
Admission: RE | Admit: 2022-08-07 | Discharge: 2022-08-07 | Disposition: A | Discharge status: Home / Self Care | Payer: 59 | Source: Ambulatory Visit | Attending: Family Medicine | Admitting: Family Medicine

## 2022-08-07 DIAGNOSIS — M7989 Other specified soft tissue disorders: Secondary | ICD-10-CM

## 2022-11-09 ENCOUNTER — Other Ambulatory Visit: Payer: Self-pay

## 2022-11-09 NOTE — Telephone Encounter (Signed)
Prescription Request  11/09/2022  LOV: 07/25/22 acute and 05/18/22 annual exam with Dr Einar Pheasant  What is the name of the medication or equipment? Kurvelo 28 tabs  Last written 05/18/22 # 84 x 1 and last refill 08/11/22.  Have you contacted your pharmacy to request a refill? Yes   Which pharmacy would you like this sent to?  [id number] [address] [place] [address]Phone: [phone]: [phone]   Patient notified that their request is being sent to the clinical staff for review and that they should receive a response within 2 business days.   Please advise at  [facility] [place]    Pt has TOC with TEugenia PancoastFNP on 11/16/22.  Sending refill request to PDutch QuintFNP.

## 2022-11-10 MED ORDER — KURVELO 0.15-30 MG-MCG PO TABS: 1.0000 | ORAL_TABLET | Freq: Every day | ORAL | 1 refills | Status: DC

## 2022-11-13 ENCOUNTER — Other Ambulatory Visit: Payer: Self-pay

## 2022-11-13 DIAGNOSIS — G43009 Migraine without aura, not intractable, without status migrainosus: Secondary | ICD-10-CM

## 2022-11-13 MED ORDER — AMITRIPTYLINE HCL 25 MG PO TABS: 25.0000 mg | ORAL_TABLET | Freq: Every day | ORAL | 1 refills | Status: DC

## 2022-11-13 NOTE — Telephone Encounter (Signed)
Prescription Request  11/13/2022  LOV: 07/25/22 acute and 05/18/22 annual exam with Dr Einar Pheasant  What is the name of the medication or equipment? Amitriptyline 25 mg  Last refilled # 90 x 1 on 05/18/22.  Have you contacted your pharmacy to request a refill? Yes   Which pharmacy would you like this sent to?  [id number] [place] [address] [place] [address]Phone: [phone]: [phone]   Patient notified that their request is being sent to the clinical staff for review and that they should receive a response within 2 business days.   Please advise at  [facility]  Pt has TOC with TEugenia PancoastFNP scheduled on 11/16/22.  Sending refill request to PDutch QuintFNP.

## 2022-11-16 ENCOUNTER — Ambulatory Visit: Payer: 59 | Admitting: Family

## 2023-01-25 ENCOUNTER — Ambulatory Visit: Payer: 59 | Admitting: Primary Care

## 2023-01-25 ENCOUNTER — Encounter: Payer: Self-pay | Admitting: Primary Care

## 2023-01-25 VITALS — HR 90 | Temp 98.2°F | Ht 62.0 in | Wt 145.0 lb

## 2023-01-25 DIAGNOSIS — R300 Dysuria: Secondary | ICD-10-CM | POA: Diagnosis not present

## 2023-01-25 LAB — POC URINALSYSI DIPSTICK (AUTOMATED)
Bilirubin, UA: NEGATIVE
Blood, UA: POSITIVE
Glucose, UA: NEGATIVE
Ketones, UA: NEGATIVE
Nitrite, UA: POSITIVE
Protein, UA: POSITIVE — AB
Spec Grav, UA: 1.015 (ref 1.010–1.025)
Urobilinogen, UA: NEGATIVE E.U./dL — AB
pH, UA: 6 (ref 5.0–8.0)

## 2023-01-25 MED ORDER — SULFAMETHOXAZOLE-TRIMETHOPRIM 800-160 MG PO TABS
1.0000 | ORAL_TABLET | Freq: Two times a day (BID) | ORAL | 0 refills | Status: DC
Start: 2023-01-25 — End: 2023-03-16

## 2023-01-25 NOTE — Progress Notes (Signed)
Subjective:    Patient ID: Glenda Weiss, female    DOB: April 14, 1981, 42 y.o.   MRN: [id number]  HPI  Glenda Weiss is a very pleasant 42 y.o. female patient of Dr. Selena Batten with a history of type 2 diabetes, beta thalassemia trait, hyperlipidemia who presents today to discuss urinary urgency.  Symptom onset 6 days ago with urinary urgency and dysuria. Two days ago she began noticing right lower back and right hip pain, this woke her form sleep. She's feeling some better today but still has burning after urination.  She drinks plenty of water normally, has noticed a darker yellow recently.   She denies hematuria, frequency, vaginal discharge/itching, pelvic pain, fevers, chills, history of renal stones.  She has not taken anything OTC for her symptoms.   BP Readings from Last 3 Encounters:  07/25/22 124/80  05/18/22 100/60  05/17/21 102/80      Review of Systems  Constitutional:  Negative for chills and fever.  Gastrointestinal:  Negative for abdominal pain.  Genitourinary:  Positive for dysuria. Negative for pelvic pain and vaginal discharge.  Musculoskeletal:  Positive for back pain.         Past Medical History:  Diagnosis Date   Medical history non-contributory    Migraines     Social History   Socioeconomic History   Marital status: Divorced    Spouse name: Not on file   Number of children: Not on file   Years of education: Not on file   Highest education level: Not on file  Occupational History   Not on file  Tobacco Use   Smoking status: Former    Types: Cigarettes    Quit date: 09/05/2019    Years since quitting: 3.3   Smokeless tobacco: Never   Tobacco comments:    Hx of light smoking  Vaping Use   Vaping Use: Never used  Substance and Sexual Activity   Alcohol use: Yes    Comment: occasional - rare   Drug use: No   Sexual activity: Yes    Birth control/protection: None  Other Topics Concern   Not on file  Social History Narrative   Not on  file   Social Determinants of Health   Financial Resource Strain: Not on file  Food Insecurity: Not on file  Transportation Needs: Not on file  Physical Activity: Not on file  Stress: Not on file  Social Connections: Not on file  Intimate Partner Violence: Not on file    Past Surgical History:  Procedure Laterality Date   DILATION AND EVACUATION N/A 08/05/2014   Procedure: DILATATION AND EVACUATION (D&E) 2ND TRIMESTER;  Surgeon: Levi Aland, MD;  Location: [facility];  Service: Gynecology;  Laterality: N/A;   NO PAST SURGERIES      Family History  Problem Relation Age of Onset   Healthy Mother    Healthy Father    Drug abuse Neg Hx     No Known Allergies  Current Outpatient Medications on File Prior to Visit  Medication Sig Dispense Refill   amitriptyline (ELAVIL) 25 MG tablet Take 1 tablet (25 mg total) by mouth at bedtime. 90 tablet 1   Cholecalciferol (VITAMIN D3) 1.25 MG (50000 UT) CAPS Take 1 capsule by mouth once a week.     ibuprofen (ADVIL) 200 MG tablet 2 (two) times daily as needed.     KURVELO 0.15-30 MG-MCG tablet Take 1 tablet by mouth daily. (Patient not taking: Reported on 01/25/2023) 84 tablet 1  No current facility-administered medications on file prior to visit.    Pulse 90   Temp 98.2 F (36.8 C) (Temporal)   Ht 5\' 2"  (1.575 m)   Wt 145 lb (65.8 kg)   LMP 01/08/2023 (Exact Date)   SpO2 99%   BMI 26.52 kg/m  Objective:   Physical Exam Constitutional:      General: She is not in acute distress. Cardiovascular:     Rate and Rhythm: Normal rate and regular rhythm.  Pulmonary:     Effort: Pulmonary effort is normal.     Breath sounds: Normal breath sounds.  Abdominal:     Palpations: Abdomen is soft.     Tenderness: There is no abdominal tenderness. There is no right CVA tenderness or left CVA tenderness.           Assessment & Plan:  Dysuria Assessment & Plan: Symptoms and UA today suspicous for infection.  UA today with 3+ leuks,  positive nitrites, 2+ blood. Culture sent.  Start Bactrim DS (sulfamethoxazole/trimethoprim) tablets for urinary tract infection. Take 1 tablet by mouth twice daily for 5 days.  She will update if no improvement.  She is stable for outpatient treatment.  Orders: -     POCT Urinalysis Dipstick (Automated) -     Urine Culture -     Sulfamethoxazole-Trimethoprim; Take 1 tablet by mouth 2 (two) times daily. For urinary tract infection.  Dispense: 10 tablet; Refill: 0        Doreene Nest, NP

## 2023-01-25 NOTE — Assessment & Plan Note (Signed)
Symptoms and UA today suspicous for infection.  UA today with 3+ leuks, positive nitrites, 2+ blood. Culture sent.  Start Bactrim DS (sulfamethoxazole/trimethoprim) tablets for urinary tract infection. Take 1 tablet by mouth twice daily for 5 days.  She will update if no improvement.  She is stable for outpatient treatment.

## 2023-01-25 NOTE — Patient Instructions (Signed)
Start Bactrim DS (sulfamethoxazole/trimethoprim) tablets for urinary tract infection. Take 1 tablet by mouth twice daily for 5 days.  Please update [place] if no improvement by early next week.  It was a pleasure meeting you!

## 2023-01-26 ENCOUNTER — Encounter: Payer: 59 | Admitting: Family

## 2023-01-27 LAB — URINE CULTURE
MICRO NUMBER:: 14995835
SPECIMEN QUALITY:: ADEQUATE

## 2023-02-08 ENCOUNTER — Encounter: Payer: Self-pay | Admitting: Family Medicine

## 2023-02-12 ENCOUNTER — Encounter: Payer: 59 | Admitting: Family

## 2023-02-14 ENCOUNTER — Encounter: Payer: 59 | Admitting: Family

## 2023-03-16 ENCOUNTER — Encounter: Payer: Self-pay | Admitting: Family

## 2023-03-16 ENCOUNTER — Ambulatory Visit: Payer: 59 | Admitting: Family

## 2023-03-16 VITALS — BP 110/64 | HR 98 | Temp 98.2°F | Ht 62.0 in | Wt 150.0 lb

## 2023-03-16 DIAGNOSIS — Z0000 Encounter for general adult medical examination without abnormal findings: Secondary | ICD-10-CM | POA: Diagnosis not present

## 2023-03-16 DIAGNOSIS — E1169 Type 2 diabetes mellitus with other specified complication: Secondary | ICD-10-CM

## 2023-03-16 DIAGNOSIS — E559 Vitamin D deficiency, unspecified: Secondary | ICD-10-CM

## 2023-03-16 DIAGNOSIS — M7989 Other specified soft tissue disorders: Secondary | ICD-10-CM

## 2023-03-16 DIAGNOSIS — E785 Hyperlipidemia, unspecified: Secondary | ICD-10-CM

## 2023-03-16 DIAGNOSIS — G43009 Migraine without aura, not intractable, without status migrainosus: Secondary | ICD-10-CM

## 2023-03-16 DIAGNOSIS — J301 Allergic rhinitis due to pollen: Secondary | ICD-10-CM | POA: Insufficient documentation

## 2023-03-16 DIAGNOSIS — E119 Type 2 diabetes mellitus without complications: Secondary | ICD-10-CM

## 2023-03-16 LAB — MICROALBUMIN / CREATININE URINE RATIO
Creatinine,U: 63.6 mg/dL
Microalb Creat Ratio: 1.1 mg/g (ref 0.0–30.0)
Microalb, Ur: <0.7 mg/dL (ref 0.0–1.9)

## 2023-03-16 LAB — POCT GLYCOSYLATED HEMOGLOBIN (HGB A1C): Hemoglobin A1C: 5.7 % — AB (ref 4.0–5.6)

## 2023-03-16 NOTE — Addendum Note (Signed)
Addended by: Alvina Chou on: 03/16/2023 09:02 AM   Modules accepted: Orders

## 2023-03-16 NOTE — Assessment & Plan Note (Signed)
Enlarged occipital lymph node with allergy presentation on exam Recommend otc zyrtec/claritin nightly for one week to see if symptoms improve

## 2023-03-16 NOTE — Assessment & Plan Note (Signed)
Improved.  Urine microalbumin today.  Work on diabetic diet and exercise as tolerated. Yearly foot exam, and annual eye exam.

## 2023-03-16 NOTE — Assessment & Plan Note (Signed)
Stable  Advised pt if any increase in size or tenderness let me know and we will investigate further. Suspected lipoma

## 2023-03-16 NOTE — Assessment & Plan Note (Signed)
Patient Counseling(The following topics were reviewed): ? Preventative care handout given to pt  ?Health maintenance and immunizations reviewed. Please refer to Health maintenance section. ?Pt advised on safe sex, wearing seatbelts in car, and proper nutrition ?labwork ordered today for annual ?Dental health: Discussed importance of regular tooth brushing, flossing, and dental visits. ? ? ?

## 2023-03-16 NOTE — Assessment & Plan Note (Signed)
Stable continue elavil

## 2023-03-16 NOTE — Progress Notes (Signed)
Established Patient Office Visit  Subjective:  Patient ID: Glenda Weiss, female    DOB: 07/27/1981  Age: 42 y.o. MRN: [id number]  CC:  Chief Complaint  Patient presents with   Establish Care    From Dr. Selena Batten     HPI Glenda Weiss is here for a transition of care visit as well as here for annual exam.   Prior provider was: Dr. Gweneth Dimitri  Mammogram: 11/15/21 normal per pt   Pt is with acute concerns.  Left posterior head with throbbing headache pain. Not currently having any allergy symptoms.   Dental exam: overdue, trying to get a new dentist  Exercise:  General die No living will  No known h/o STD   chronic concerns:  DM2: overdue for annual eye exam, last one in 2022.  Lab Results  Component Value Date   HGBA1C 5.7 (A) 03/16/2023   HLD:  Lab Results  Component Value Date   CHOL 166 05/18/2022   HDL 63.10 05/18/2022   LDLCALC 86 05/18/2022   TRIG 87.0 05/18/2022   CHOLHDL 3 05/18/2022   Soft tissue mass left rib side, u/s 11/23 and was unremarkable. Pt states not growing in size, stable. Does not hurt.   Vertigo, migraines: amitriptyline, has not had an episode in a while. Takes ibuprofen prn with headache, dulls the pain when it occurs. When it occurs constant throbbing on the left temple. Can last a day or two.     Past Medical History:  Diagnosis Date   Medical history non-contributory    Migraines     Past Surgical History:  Procedure Laterality Date   DILATION AND EVACUATION N/A 08/05/2014   Procedure: DILATATION AND EVACUATION (D&E) 2ND TRIMESTER;  Surgeon: Levi Aland, MD;  Location: [facility];  Service: Gynecology;  Laterality: N/A;    Family History  Problem Relation Age of Onset   Healthy Mother    Healthy Father    Drug abuse Neg Hx     Social History   Socioeconomic History   Marital status: Significant Other    Spouse name: Not on file   Number of children: 2   Years of education: Not on file   Highest education level:  Not on file  Occupational History    Comment: admin  Tobacco Use   Smoking status: Former    Current packs/day: 0.00    Types: Cigarettes    Quit date: 09/05/2019    Years since quitting: 3.5   Smokeless tobacco: Never   Tobacco comments:    Hx of light smoking  Vaping Use   Vaping status: Never Used  Substance and Sexual Activity   Alcohol use: Yes    Comment: occasional - rare   Drug use: No   Sexual activity: Yes    Partners: Male    Birth control/protection: None  Other Topics Concern   Not on file  Social History Narrative   Two kids 59 and 6 y/o    Social Determinants of Corporate investment banker Strain: Not on file  Food Insecurity: Not on file  Transportation Needs: Not on file  Physical Activity: Not on file  Stress: Not on file  Social Connections: Not on file  Intimate Partner Violence: Not on file    Outpatient Medications Prior to Visit  Medication Sig Dispense Refill   amitriptyline (ELAVIL) 25 MG tablet Take 1 tablet (25 mg total) by mouth at bedtime. 90 tablet 1   ibuprofen (ADVIL) 200 MG tablet  2 (two) times daily as needed.     Cholecalciferol (VITAMIN D3) 1.25 MG (50000 UT) CAPS Take 1 capsule by mouth once a week. (Patient not taking: Reported on 03/16/2023)     KURVELO 0.15-30 MG-MCG tablet Take 1 tablet by mouth daily. (Patient not taking: Reported on 01/25/2023) 84 tablet 1   sulfamethoxazole-trimethoprim (BACTRIM DS) 800-160 MG tablet Take 1 tablet by mouth 2 (two) times daily. For urinary tract infection. (Patient not taking: Reported on 03/16/2023) 10 tablet 0   No facility-administered medications prior to visit.    No Known Allergies  ROS: Pertinent symptoms negative unless otherwise noted in HPI      Objective:    Physical Exam Vitals reviewed.  Constitutional:      Appearance: Normal appearance.  Eyes:     General:        Right eye: No discharge.        Left eye: No discharge.     Conjunctiva/sclera: Conjunctivae normal.   Cardiovascular:     Rate and Rhythm: Normal rate and regular rhythm.  Pulmonary:     Effort: Pulmonary effort is normal. No respiratory distress.     Breath sounds: Normal breath sounds.  Musculoskeletal:        General: Normal range of motion.     Cervical back: Normal range of motion.  Neurological:     General: No focal deficit present.     Mental Status: She is alert and oriented to person, place, and time. Mental status is at baseline.  Psychiatric:        Mood and Affect: Mood normal.        Behavior: Behavior normal.        Thought Content: Thought content normal.        Judgment: Judgment normal.      BP 110/64   Pulse 98   Temp 98.2 F (36.8 C) (Temporal)   Ht 5\' 2"  (1.575 m)   Wt 150 lb (68 kg)   SpO2 (!) 85%   BMI 27.44 kg/m  Wt Readings from Last 3 Encounters:  03/16/23 150 lb (68 kg)  01/25/23 145 lb (65.8 kg)  07/25/22 144 lb (65.3 kg)     Health Maintenance Due  Topic Date Due   Hepatitis C Screening  Never done   FOOT EXAM  11/04/2021   COVID-19 Vaccine (4 - 2023-24 season) 05/05/2022   OPHTHALMOLOGY EXAM  06/28/2022    There are no preventive care reminders to display for this patient.  Lab Results  Component Value Date   TSH 0.86 06/14/2020   Lab Results  Component Value Date   WBC 7.7 06/14/2020   HGB 12.2 06/14/2020   HCT 39.0 06/14/2020   MCV 68.6 Repeated and verified X2. (L) 06/14/2020   PLT 181.0 06/14/2020   Lab Results  Component Value Date   NA 138 05/18/2022   K 4.1 05/18/2022   CO2 29 05/18/2022   GLUCOSE 81 05/18/2022   BUN 12 05/18/2022   CREATININE 0.80 05/18/2022   BILITOT 0.4 05/18/2022   ALKPHOS 31 (L) 05/18/2022   AST 14 05/18/2022   ALT 11 05/18/2022   PROT 7.5 05/18/2022   ALBUMIN 3.9 05/18/2022   CALCIUM 9.3 05/18/2022   ANIONGAP 11 09/14/2016   GFR 90.66 05/18/2022   Lab Results  Component Value Date   CHOL 166 05/18/2022   Lab Results  Component Value Date   HDL 63.10 05/18/2022   Lab  Results  Component Value Date  LDLCALC 86 05/18/2022   Lab Results  Component Value Date   TRIG 87.0 05/18/2022   Lab Results  Component Value Date   CHOLHDL 3 05/18/2022   Lab Results  Component Value Date   HGBA1C 5.7 (A) 03/16/2023      Assessment & Plan:   Type 2 diabetes mellitus without complication, without long-term current use of insulin (HCC) Assessment & Plan: Improved.  Urine microalbumin today.  Work on diabetic diet and exercise as tolerated. Yearly foot exam, and annual eye exam.    Orders: -     Microalbumin / creatinine urine ratio -     POCT glycosylated hemoglobin (Hb A1C) -     Hemoglobin A1c  Hyperlipidemia associated with type 2 diabetes mellitus (HCC) -     Lipid panel  Soft tissue mass Assessment & Plan: Stable  Advised pt if any increase in size or tenderness let me know and we will investigate further. Suspected lipoma   Vitamin D deficiency -     VITAMIN D 25 Hydroxy (Vit-D Deficiency, Fractures); Future  Seasonal allergic rhinitis due to pollen Assessment & Plan: Enlarged occipital lymph node with allergy presentation on exam Recommend otc zyrtec/claritin nightly for one week to see if symptoms improve   Migraine without aura and without status migrainosus, not intractable Assessment & Plan: Stable continue elavil   Encounter for general adult medical examination without abnormal findings Assessment & Plan: Patient Counseling(The following topics were reviewed):  Preventative care handout given to pt  Health maintenance and immunizations reviewed. Please refer to Health maintenance section. Pt advised on safe sex, wearing seatbelts in car, and proper nutrition labwork ordered today for annual Dental health: Discussed importance of regular tooth brushing, flossing, and dental visits.      No orders of the defined types were placed in this encounter.   Follow-up: Return in about 1 year (around 03/15/2024) for f/u CPE.     Mort Sawyers, FNP

## 2023-05-16 ENCOUNTER — Other Ambulatory Visit: Payer: Self-pay | Admitting: Family

## 2023-05-16 DIAGNOSIS — G43009 Migraine without aura, not intractable, without status migrainosus: Secondary | ICD-10-CM

## 2023-05-23 NOTE — Telephone Encounter (Signed)
Patient called in to follow up on this refill. Thank you!

## 2024-02-14 ENCOUNTER — Ambulatory Visit (INDEPENDENT_AMBULATORY_CARE_PROVIDER_SITE_OTHER): Admitting: Family

## 2024-02-14 ENCOUNTER — Encounter: Payer: Self-pay | Admitting: Family

## 2024-02-14 VITALS — BP 116/72 | HR 76 | Temp 98.3°F | Ht 62.0 in | Wt 151.2 lb

## 2024-02-14 DIAGNOSIS — G43009 Migraine without aura, not intractable, without status migrainosus: Secondary | ICD-10-CM | POA: Diagnosis not present

## 2024-02-14 DIAGNOSIS — Z0000 Encounter for general adult medical examination without abnormal findings: Secondary | ICD-10-CM | POA: Diagnosis not present

## 2024-02-14 DIAGNOSIS — E559 Vitamin D deficiency, unspecified: Secondary | ICD-10-CM | POA: Diagnosis not present

## 2024-02-14 DIAGNOSIS — E785 Hyperlipidemia, unspecified: Secondary | ICD-10-CM

## 2024-02-14 DIAGNOSIS — E1169 Type 2 diabetes mellitus with other specified complication: Secondary | ICD-10-CM

## 2024-02-14 DIAGNOSIS — E119 Type 2 diabetes mellitus without complications: Secondary | ICD-10-CM

## 2024-02-14 NOTE — Assessment & Plan Note (Signed)
 Ordered vitamin d pending results.

## 2024-02-14 NOTE — Assessment & Plan Note (Signed)
 Diet controlled, doing well.  A1c and urine m/a today  Pt up to date with eye exam.

## 2024-02-14 NOTE — Assessment & Plan Note (Signed)

## 2024-02-14 NOTE — Progress Notes (Signed)
 Subjective:  Patient ID: Glenda Weiss, female    DOB: 1981/02/07  Age: 43 y.o. MRN: [id number]  Patient Care Team: Felicita Horns, FNP as PCP - General (Family Medicine)   CC:  Chief Complaint  Patient presents with   Annual Exam    HPI Glenda Weiss is a 43 y.o. female who presents today for an annual physical exam. She has a different insurance than she had last year.   She reports consuming a general diet. The patient does not participate in regular exercise at present. She generally feels well. She reports sleeping fairly well. She does not have additional problems to discuss today.   Vision:Within last year Dental:Receives regular dental care  Mammogram: 01/2024 [facility] per pt negative  Last pap: 01/2024 she sees [facility] per pt negative  Pt is withnot acute concerns.   Migraines, doing well. Currently taking elavil  which has been helpful. Less frequent.     Advanced Directives Patient does not have advanced directives  DEPRESSION SCREENING    02/14/2024    8:27 AM 03/16/2023    7:17 AM 05/18/2022    9:01 AM 08/30/2020   10:43 AM 08/06/2020   12:28 PM 01/09/2020    3:52 PM 06/20/2019   11:47 AM  PHQ 2/9 Scores  PHQ - 2 Score 0 0 0 0 0 0 0  PHQ- 9 Score 1 0          ROS: Negative unless specifically indicated above in HPI.    Current Outpatient Medications:    amitriptyline  (ELAVIL ) 25 MG tablet, TAKE 1 TABLET BY MOUTH EVERYDAY AT BEDTIME, Disp: 90 tablet, Rfl: 3   ibuprofen  (ADVIL ) 200 MG tablet, 2 (two) times daily as needed., Disp: , Rfl:     Objective:    BP 116/72 (BP Location: Left Arm, Patient Position: Sitting)   Pulse 76   Temp 98.3 F (36.8 C) (Temporal)   Ht 5' 2 (1.575 m)   Wt 151 lb 3.2 oz (68.6 kg)   LMP 01/21/2024 (Exact Date)   SpO2 98%   BMI 27.65 kg/m   BP Readings from Last 3 Encounters:  02/14/24 116/72  03/16/23 110/64  07/25/22 124/80   Title   Diabetic Foot Exam - detailed Is there a history of  foot ulcer?: No Is there a foot ulcer now?: No Is there swelling?: No Is there elevated skin temperature?: No Is there abnormal foot shape?: No Is there a claw toe deformity?: No Are the toenails long?: No Are the toenails thick?: No Are the toenails ingrown?: No Is the skin thin, fragile, shiny and hairless?: No Normal Range of Motion?: Yes Is there foot or ankle muscle weakness?: No Do you have pain in calf while walking?: No Are the shoes appropriate in style and fit?: Yes Can the patient see the bottom of their feet?: Yes Pulse Foot Exam completed.: Yes   Right Posterior Tibialis: Present Left posterior Tibialis: Present   Right Dorsalis Pedis: Present Left Dorsalis Pedis: Present     Semmes-Weinstein Monofilament Test + means has sensation and - means no sensation  R Foot Test Control: Pos L Foot Test Control: Pos   R Site 1-Great Toe: Pos L Site 1-Great Toe: Pos   R Site 4: Pos L Site 4: Pos   R site 5: Pos L Site 5: Pos  R Site 6: Pos L Site 6: Pos     Image components are not supported.   Image components are not supported.  Image components are not supported.  [place] Comments       Physical Exam Constitutional:      General: She is not in acute distress.    Appearance: Normal appearance. She is normal weight. She is not ill-appearing.  HENT:     Head: Normocephalic.     Right Ear: Tympanic membrane normal.     Left Ear: Tympanic membrane normal.     Nose: Nose normal.     Mouth/Throat:     Mouth: Mucous membranes are moist.   Eyes:     Extraocular Movements: Extraocular movements intact.     Pupils: Pupils are equal, round, and reactive to light.    Cardiovascular:     Rate and Rhythm: Normal rate and regular rhythm.  Pulmonary:     Effort: Pulmonary effort is normal.     Breath sounds: Normal breath sounds.  Abdominal:     General: Abdomen is flat. Bowel sounds are normal.     Palpations: Abdomen is soft.     Tenderness: There  is no guarding or rebound.   Musculoskeletal:        General: Normal range of motion.     Cervical back: Normal range of motion.   Skin:    General: Skin is warm.     Capillary Refill: Capillary refill takes less than 2 seconds.   Neurological:     General: No focal deficit present.     Mental Status: She is alert.   Psychiatric:        Mood and Affect: Mood normal.        Behavior: Behavior normal.        Thought Content: Thought content normal.        Judgment: Judgment normal.          Assessment & Plan:  Hyperlipidemia associated with type 2 diabetes mellitus (HCC) Assessment & Plan: Ordered lipid panel, pending results. Work on low cholesterol diet and exercise as tolerated   Orders: -     Lipid panel  Type 2 diabetes mellitus without complication, without long-term current use of insulin (HCC) Assessment & Plan: Diet controlled, doing well.  A1c and urine m/a today  Pt up to date with eye exam.    Orders: -     Comprehensive metabolic panel with GFR -     Hemoglobin A1c -     Microalbumin / creatinine urine ratio  Vitamin D  deficiency Assessment & Plan: Ordered vitamin d  pending results.    Orders: -     VITAMIN D  25 Hydroxy (Vit-D Deficiency, Fractures)  Encounter for general adult medical examination without abnormal findings Assessment & Plan: Patient Counseling(The following topics were reviewed):  Preventative care handout given to pt  Health maintenance and immunizations reviewed. Please refer to Health maintenance section. Pt advised on safe sex, wearing seatbelts in car, and proper nutrition labwork ordered today for annual Dental health: Discussed importance of regular tooth brushing, flossing, and dental visits.   Orders: -     Lipid panel -     Comprehensive metabolic panel with GFR -     CBC -     Hemoglobin A1c -     Microalbumin / creatinine urine ratio  Migraine without aura and without status migrainosus, not  intractable Assessment & Plan: Controlled, stable.         Follow-up: Return in about 1 year (around 02/13/2025) for f/u CPE.   Felicita Horns, FNP

## 2024-02-14 NOTE — Assessment & Plan Note (Signed)
 Ordered lipid panel, pending results. Work on low cholesterol diet and exercise as tolerated

## 2024-02-14 NOTE — Assessment & Plan Note (Signed)
Controlled, stable.

## 2024-02-15 LAB — COMPREHENSIVE METABOLIC PANEL WITH GFR
AG Ratio: 1.2 (calc) (ref 1.0–2.5)
ALT: 12 U/L (ref 6–29)
AST: 16 U/L (ref 10–30)
Albumin: 4 g/dL (ref 3.6–5.1)
Alkaline phosphatase (APISO): 35 U/L (ref 31–125)
BUN: 13 mg/dL (ref 7–25)
CO2: 23 mmol/L (ref 20–32)
Calcium: 9.3 mg/dL (ref 8.6–10.2)
Chloride: 106 mmol/L (ref 98–110)
Creat: 0.77 mg/dL (ref 0.50–0.99)
Globulin: 3.4 g/dL (ref 1.9–3.7)
Glucose, Bld: 90 mg/dL (ref 65–99)
Potassium: 4.3 mmol/L (ref 3.5–5.3)
Sodium: 139 mmol/L (ref 135–146)
Total Bilirubin: 0.4 mg/dL (ref 0.2–1.2)
Total Protein: 7.4 g/dL (ref 6.1–8.1)
eGFR: 97 mL/min/{1.73_m2} (ref >=60)

## 2024-02-15 LAB — LIPID PANEL
Cholesterol: 169 mg/dL (ref <200)
HDL: 70 mg/dL (ref >=50)
LDL Cholesterol (Calc): 81 mg/dL
Non-HDL Cholesterol (Calc): 99 mg/dL (ref <130)
Total CHOL/HDL Ratio: 2.4 (calc) (ref <5.0)
Triglycerides: 99 mg/dL (ref <150)

## 2024-02-15 LAB — CBC
HCT: 38.3 % (ref 35.0–45.0)
Hemoglobin: 11.3 g/dL — ABNORMAL LOW (ref 11.7–15.5)
MCH: 21.1 pg — ABNORMAL LOW (ref 27.0–33.0)
MCHC: 29.5 g/dL — ABNORMAL LOW (ref 32.0–36.0)
MCV: 71.6 fL — ABNORMAL LOW (ref 80.0–100.0)
Platelets: 149 10*3/uL (ref 140–400)
RBC: 5.35 10*6/uL — ABNORMAL HIGH (ref 3.80–5.10)
RDW: 15.4 % — ABNORMAL HIGH (ref 11.0–15.0)
WBC: 4.7 10*3/uL (ref 3.8–10.8)

## 2024-02-15 LAB — HEMOGLOBIN A1C
Hgb A1c MFr Bld: 5.9 % — ABNORMAL HIGH (ref <5.7)
Mean Plasma Glucose: 123 mg/dL
eAG (mmol/L): 6.8 mmol/L

## 2024-02-15 LAB — MICROALBUMIN / CREATININE URINE RATIO
Creatinine, Urine: 40 mg/dL (ref 20–275)
Microalb Creat Ratio: 8 mg/g{creat} (ref <30)
Microalb, Ur: 0.3 mg/dL

## 2024-02-15 LAB — VITAMIN D 25 HYDROXY (VIT D DEFICIENCY, FRACTURES): Vit D, 25-Hydroxy: 31 ng/mL (ref 30–100)

## 2024-02-18 ENCOUNTER — Ambulatory Visit: Payer: Self-pay | Admitting: Family

## 2024-02-24 ENCOUNTER — Other Ambulatory Visit: Payer: Self-pay | Admitting: Family

## 2024-02-24 DIAGNOSIS — G43009 Migraine without aura, not intractable, without status migrainosus: Secondary | ICD-10-CM

## 2024-03-15 DIAGNOSIS — Z419 Encounter for procedure for purposes other than remedying health state, unspecified: Secondary | ICD-10-CM | POA: Diagnosis not present

## 2024-04-15 DIAGNOSIS — Z419 Encounter for procedure for purposes other than remedying health state, unspecified: Secondary | ICD-10-CM | POA: Diagnosis not present

## 2024-05-16 DIAGNOSIS — Z419 Encounter for procedure for purposes other than remedying health state, unspecified: Secondary | ICD-10-CM | POA: Diagnosis not present

## 2024-08-15 DIAGNOSIS — Z419 Encounter for procedure for purposes other than remedying health state, unspecified: Secondary | ICD-10-CM | POA: Diagnosis not present
